# Patient Record
Sex: Male | Born: 1950 | Race: Black or African American | Hispanic: No | Marital: Married | Smoking: Current every day smoker
Health system: Southern US, Community
[De-identification: ages and names within clinical notes are randomized; demographics above are authoritative.]

## PROBLEM LIST (undated history)

## (undated) DIAGNOSIS — Z86718 Personal history of other venous thrombosis and embolism: Secondary | ICD-10-CM

## (undated) DIAGNOSIS — I10 Essential (primary) hypertension: Secondary | ICD-10-CM

## (undated) DIAGNOSIS — E119 Type 2 diabetes mellitus without complications: Secondary | ICD-10-CM

## (undated) DIAGNOSIS — R569 Unspecified convulsions: Secondary | ICD-10-CM

---

## 2003-05-06 ENCOUNTER — Other Ambulatory Visit: Payer: Self-pay

## 2003-07-07 ENCOUNTER — Other Ambulatory Visit: Payer: Self-pay

## 2006-07-08 ENCOUNTER — Emergency Department: Payer: Self-pay | Admitting: Internal Medicine

## 2006-07-17 ENCOUNTER — Ambulatory Visit: Payer: Self-pay | Admitting: Podiatry

## 2011-11-14 ENCOUNTER — Inpatient Hospital Stay: Payer: Self-pay | Admitting: Internal Medicine

## 2011-11-14 LAB — URINALYSIS, COMPLETE
Bacteria: NONE SEEN
Bilirubin,UR: NEGATIVE
Blood: NEGATIVE
Glucose,UR: >=500 mg/dL (ref 0–75)
Nitrite: NEGATIVE
Ph: 6 (ref 4.5–8.0)
Specific Gravity: 1.011 (ref 1.003–1.030)
Squamous Epithelial: NONE SEEN
WBC UR: <1 /HPF (ref 0–5)

## 2011-11-14 LAB — CBC WITH DIFFERENTIAL/PLATELET
Basophil %: 1.5 %
Eosinophil #: 0.1 10*3/uL (ref 0.0–0.7)
HGB: 13.6 g/dL (ref 13.0–18.0)
Lymphocyte %: 24.4 %
MCHC: 33.3 g/dL (ref 32.0–36.0)
Monocyte #: 0.5 x10 3/mm (ref 0.2–1.0)
Neutrophil %: 62.4 %
Platelet: 146 10*3/uL — ABNORMAL LOW (ref 150–440)
RBC: 4.17 10*6/uL — ABNORMAL LOW (ref 4.40–5.90)

## 2011-11-14 LAB — COMPREHENSIVE METABOLIC PANEL
Alkaline Phosphatase: 103 U/L (ref 50–136)
Anion Gap: 7 (ref 7–16)
BUN: 14 mg/dL (ref 7–18)
Bilirubin,Total: 0.4 mg/dL (ref 0.2–1.0)
Calcium, Total: 9.2 mg/dL (ref 8.5–10.1)
Chloride: 102 mmol/L (ref 98–107)
Co2: 28 mmol/L (ref 21–32)
Creatinine: 1.17 mg/dL (ref 0.60–1.30)
EGFR (African American): >60
EGFR (Non-African Amer.): >60
Osmolality: 284 (ref 275–301)
Potassium: 4.3 mmol/L (ref 3.5–5.1)
SGPT (ALT): 18 U/L
Sodium: 137 mmol/L (ref 136–145)
Total Protein: 8.1 g/dL (ref 6.4–8.2)

## 2011-11-14 LAB — APTT: Activated PTT: 26.7 secs (ref 23.6–35.9)

## 2011-11-14 LAB — HEMOGLOBIN A1C: Hemoglobin A1C: 11 % — ABNORMAL HIGH (ref 4.2–6.3)

## 2011-11-14 LAB — PROTIME-INR: INR: 0.9

## 2011-11-15 LAB — CBC WITH DIFFERENTIAL/PLATELET
Basophil #: 0.1 10*3/uL (ref 0.0–0.1)
Eosinophil %: 3.9 %
HCT: 34.1 % — ABNORMAL LOW (ref 40.0–52.0)
HGB: 11.2 g/dL — ABNORMAL LOW (ref 13.0–18.0)
Lymphocyte #: 1.2 10*3/uL (ref 1.0–3.6)
Lymphocyte %: 20.3 %
MCH: 32.2 pg (ref 26.0–34.0)
MCHC: 33 g/dL (ref 32.0–36.0)
MCV: 98 fL (ref 80–100)
Monocyte #: 0.6 x10 3/mm (ref 0.2–1.0)
Monocyte %: 10.4 %
Neutrophil %: 64.2 %
Platelet: 134 10*3/uL — ABNORMAL LOW (ref 150–440)
RBC: 3.49 10*6/uL — ABNORMAL LOW (ref 4.40–5.90)
WBC: 5.7 10*3/uL (ref 3.8–10.6)

## 2011-11-15 LAB — BASIC METABOLIC PANEL
Anion Gap: 7 (ref 7–16)
BUN: 22 mg/dL — ABNORMAL HIGH (ref 7–18)
Calcium, Total: 8.4 mg/dL — ABNORMAL LOW (ref 8.5–10.1)
Creatinine: 1.4 mg/dL — ABNORMAL HIGH (ref 0.60–1.30)
EGFR (African American): >60
EGFR (Non-African Amer.): 54 — ABNORMAL LOW
Glucose: 108 mg/dL — ABNORMAL HIGH (ref 65–99)
Osmolality: 291 (ref 275–301)

## 2012-09-12 ENCOUNTER — Ambulatory Visit: Payer: Self-pay | Admitting: Gastroenterology

## 2013-02-04 ENCOUNTER — Inpatient Hospital Stay: Payer: Self-pay | Admitting: Internal Medicine

## 2013-02-04 LAB — CBC
MCH: 31.8 pg (ref 26.0–34.0)
MCV: 93 fL (ref 80–100)
RBC: 4.01 10*6/uL — ABNORMAL LOW (ref 4.40–5.90)
WBC: 9.4 10*3/uL (ref 3.8–10.6)

## 2013-02-04 LAB — PROTIME-INR: Prothrombin Time: 13.7 secs (ref 11.5–14.7)

## 2013-02-04 LAB — BASIC METABOLIC PANEL
Calcium, Total: 9 mg/dL (ref 8.5–10.1)
Chloride: 98 mmol/L (ref 98–107)
Co2: 23 mmol/L (ref 21–32)
EGFR (African American): >60
EGFR (Non-African Amer.): 53 — ABNORMAL LOW
Osmolality: 277 (ref 275–301)
Potassium: 5.2 mmol/L — ABNORMAL HIGH (ref 3.5–5.1)

## 2013-02-05 LAB — BASIC METABOLIC PANEL
Anion Gap: 7 (ref 7–16)
Calcium, Total: 8.9 mg/dL (ref 8.5–10.1)
Chloride: 103 mmol/L (ref 98–107)
Co2: 24 mmol/L (ref 21–32)
Creatinine: 1.68 mg/dL — ABNORMAL HIGH (ref 0.60–1.30)
EGFR (African American): 50 — ABNORMAL LOW
EGFR (Non-African Amer.): 43 — ABNORMAL LOW
Glucose: 91 mg/dL (ref 65–99)
Osmolality: 278 (ref 275–301)
Sodium: 134 mmol/L — ABNORMAL LOW (ref 136–145)

## 2013-02-05 LAB — CBC WITH DIFFERENTIAL/PLATELET
Basophil #: 0.1 10*3/uL (ref 0.0–0.1)
Basophil %: 0.6 %
Eosinophil %: 0.3 %
HCT: 35.8 % — ABNORMAL LOW (ref 40.0–52.0)
Lymphocyte #: 0.6 10*3/uL — ABNORMAL LOW (ref 1.0–3.6)
Lymphocyte %: 5.6 %
MCH: 31.5 pg (ref 26.0–34.0)
MCHC: 34.5 g/dL (ref 32.0–36.0)
MCV: 91 fL (ref 80–100)
Monocyte #: 0.7 x10 3/mm (ref 0.2–1.0)
Neutrophil #: 8.7 10*3/uL — ABNORMAL HIGH (ref 1.4–6.5)
Neutrophil %: 87 %
Platelet: 213 10*3/uL (ref 150–440)
RBC: 3.92 10*6/uL — ABNORMAL LOW (ref 4.40–5.90)
WBC: 10 10*3/uL (ref 3.8–10.6)

## 2013-02-06 LAB — BASIC METABOLIC PANEL
Chloride: 101 mmol/L (ref 98–107)
Co2: 24 mmol/L (ref 21–32)
EGFR (African American): 57 — ABNORMAL LOW
EGFR (Non-African Amer.): 49 — ABNORMAL LOW
Osmolality: 276 (ref 275–301)
Potassium: 4.2 mmol/L (ref 3.5–5.1)
Sodium: 132 mmol/L — ABNORMAL LOW (ref 136–145)

## 2013-02-06 LAB — CBC WITH DIFFERENTIAL/PLATELET
HGB: 12 g/dL — ABNORMAL LOW (ref 13.0–18.0)
Lymphocyte #: 0.8 10*3/uL — ABNORMAL LOW (ref 1.0–3.6)
MCH: 31.9 pg (ref 26.0–34.0)
MCV: 93 fL (ref 80–100)
Neutrophil #: 7 10*3/uL — ABNORMAL HIGH (ref 1.4–6.5)
Platelet: 185 10*3/uL (ref 150–440)
RBC: 3.78 10*6/uL — ABNORMAL LOW (ref 4.40–5.90)
RDW: 15.7 % — ABNORMAL HIGH (ref 11.5–14.5)

## 2013-02-07 LAB — CBC WITH DIFFERENTIAL/PLATELET
Lymphocyte #: 0.9 10*3/uL — ABNORMAL LOW (ref 1.0–3.6)
MCV: 93 fL (ref 80–100)
Monocyte #: 1 x10 3/mm (ref 0.2–1.0)
Neutrophil %: 74.4 %
RBC: 3.68 10*6/uL — ABNORMAL LOW (ref 4.40–5.90)
WBC: 7.4 10*3/uL (ref 3.8–10.6)

## 2013-02-07 LAB — BASIC METABOLIC PANEL
Anion Gap: 6 — ABNORMAL LOW (ref 7–16)
BUN: 28 mg/dL — ABNORMAL HIGH (ref 7–18)
Creatinine: 1.22 mg/dL (ref 0.60–1.30)
EGFR (Non-African Amer.): >60
Glucose: 121 mg/dL — ABNORMAL HIGH (ref 65–99)
Osmolality: 271 (ref 275–301)
Sodium: 132 mmol/L — ABNORMAL LOW (ref 136–145)

## 2013-02-07 LAB — URINALYSIS, COMPLETE
Bilirubin,UR: NEGATIVE
Nitrite: NEGATIVE
Ph: 5 (ref 4.5–8.0)
Protein: 30
Squamous Epithelial: NONE SEEN
WBC UR: 1 /HPF (ref 0–5)

## 2013-02-08 LAB — BASIC METABOLIC PANEL
BUN: 21 mg/dL — ABNORMAL HIGH (ref 7–18)
Calcium, Total: 8.2 mg/dL — ABNORMAL LOW (ref 8.5–10.1)
Co2: 26 mmol/L (ref 21–32)
Creatinine: 1.1 mg/dL (ref 0.60–1.30)
EGFR (African American): >60
Glucose: 109 mg/dL — ABNORMAL HIGH (ref 65–99)
Osmolality: 274 (ref 275–301)
Potassium: 4.2 mmol/L (ref 3.5–5.1)

## 2013-02-09 LAB — BASIC METABOLIC PANEL
Calcium, Total: 8.1 mg/dL — ABNORMAL LOW (ref 8.5–10.1)
Chloride: 105 mmol/L (ref 98–107)
Co2: 25 mmol/L (ref 21–32)
Creatinine: 1.25 mg/dL (ref 0.60–1.30)
EGFR (African American): >60
EGFR (Non-African Amer.): >60
Glucose: 149 mg/dL — ABNORMAL HIGH (ref 65–99)
Osmolality: 273 (ref 275–301)
Sodium: 134 mmol/L — ABNORMAL LOW (ref 136–145)

## 2013-02-09 LAB — HEMOGLOBIN: HGB: 9.9 g/dL — ABNORMAL LOW (ref 13.0–18.0)

## 2013-02-09 LAB — PLATELET COUNT: Platelet: 212 10*3/uL (ref 150–440)

## 2013-02-10 LAB — BASIC METABOLIC PANEL
Anion Gap: 4 — ABNORMAL LOW (ref 7–16)
BUN: 20 mg/dL — ABNORMAL HIGH (ref 7–18)
Calcium, Total: 8.1 mg/dL — ABNORMAL LOW (ref 8.5–10.1)
Chloride: 105 mmol/L (ref 98–107)
Co2: 26 mmol/L (ref 21–32)
Glucose: 133 mg/dL — ABNORMAL HIGH (ref 65–99)
Osmolality: 275 (ref 275–301)
Sodium: 135 mmol/L — ABNORMAL LOW (ref 136–145)

## 2013-02-13 LAB — PATHOLOGY REPORT

## 2013-07-17 ENCOUNTER — Other Ambulatory Visit: Payer: Self-pay | Admitting: Internal Medicine

## 2013-07-17 LAB — TROPONIN I

## 2013-07-22 ENCOUNTER — Ambulatory Visit: Payer: Self-pay | Admitting: Internal Medicine

## 2013-11-28 ENCOUNTER — Emergency Department: Payer: Self-pay | Admitting: Emergency Medicine

## 2013-11-28 LAB — URINALYSIS, COMPLETE
Bacteria: NONE SEEN
Bilirubin,UR: NEGATIVE
GLUCOSE, UR: NEGATIVE mg/dL (ref 0–75)
Ketone: NEGATIVE
Leukocyte Esterase: NEGATIVE
Nitrite: NEGATIVE
PH: 5 (ref 4.5–8.0)
Protein: 100
SPECIFIC GRAVITY: 1.013 (ref 1.003–1.030)
Squamous Epithelial: <1
WBC UR: 1 /HPF (ref 0–5)

## 2013-11-28 LAB — COMPREHENSIVE METABOLIC PANEL
ALT: 47 U/L (ref 12–78)
ANION GAP: 4 — AB (ref 7–16)
Albumin: 3.2 g/dL — ABNORMAL LOW (ref 3.4–5.0)
Alkaline Phosphatase: 96 U/L
BUN: 38 mg/dL — AB (ref 7–18)
Bilirubin,Total: 0.3 mg/dL (ref 0.2–1.0)
CO2: 23 mmol/L (ref 21–32)
CREATININE: 1.87 mg/dL — AB (ref 0.60–1.30)
Calcium, Total: 8.5 mg/dL (ref 8.5–10.1)
Chloride: 104 mmol/L (ref 98–107)
EGFR (African American): 44 — ABNORMAL LOW
GFR CALC NON AF AMER: 38 — AB
Glucose: 228 mg/dL — ABNORMAL HIGH (ref 65–99)
Osmolality: 279 (ref 275–301)
POTASSIUM: 4.8 mmol/L (ref 3.5–5.1)
SGOT(AST): 43 U/L — ABNORMAL HIGH (ref 15–37)
SODIUM: 131 mmol/L — AB (ref 136–145)
Total Protein: 7.1 g/dL (ref 6.4–8.2)

## 2013-11-28 LAB — CBC
HCT: 33.5 % — ABNORMAL LOW (ref 40.0–52.0)
HGB: 11 g/dL — ABNORMAL LOW (ref 13.0–18.0)
MCH: 31.7 pg (ref 26.0–34.0)
MCHC: 32.9 g/dL (ref 32.0–36.0)
MCV: 96 fL (ref 80–100)
Platelet: 273 10*3/uL (ref 150–440)
RBC: 3.47 10*6/uL — ABNORMAL LOW (ref 4.40–5.90)
RDW: 15.1 % — AB (ref 11.5–14.5)
WBC: 5.2 10*3/uL (ref 3.8–10.6)

## 2013-11-28 LAB — CK TOTAL AND CKMB (NOT AT ARMC)
CK, TOTAL: 211 U/L
CK-MB: 3.1 ng/mL (ref 0.5–3.6)

## 2013-11-28 LAB — TROPONIN I: Troponin-I: <0.02 ng/mL

## 2014-09-04 NOTE — Op Note (Signed)
PATIENT NAME:  Andrew Dillon, Andrew Dillon MR#:  [id number] DATE OF BIRTH:  Apr 30, 1951  DATE OF PROCEDURE:  02/07/2013  PREOPERATIVE DIAGNOSIS: Displaced right femoral neck fracture.   POSTOPERATIVE DIAGNOSIS: Displaced right femoral neck fracture (pathology pending).   PROCEDURE PERFORMED: Right hip hemiarthroplasty.   SURGEON: Illene LabradorJames P. Angie FavaHooten Jr., MD   ANESTHESIA: Spinal.   ESTIMATED BLOOD LOSS: 300 mL.   FLUIDS REPLACED: 2200 mL of crystalloid.   DRAINS: Two medium drains to Hemovac reservoir.   IMPLANTS UTILIZED: DePuy size 6 Summit femoral stem (cemented), 12 mm Cementralizer, 52 mm outer diameter Cathcart hip ball, a +0 mm tapered spacer, and a size 4 cement restrictor.   INDICATIONS FOR SURGERY: The patient is a 64 year old male who fell at home and was unable to stand or bear weight due to right hip pain. X-ray demonstrated findings of a displaced femoral neck fracture. There was a question if this was a spontaneous fracture. The patient denies tripping or any dizziness. After discussion of the risks and benefits of surgical intervention, the patient expressed understanding of the risks and benefits and agreed with plans for surgical intervention.   PROCEDURE IN DETAIL: The patient was brought into the operating room and after adequate spinal anesthesia was achieved, the patient was placed in a left lateral decubitus position. Axillary roll was placed and all bony prominences were well padded. The patient's right hip and leg were cleaned and prepped with alcohol and DuraPrep and draped in the usual sterile fashion. A "timeout" was performed as per usual protocol. A lateral curvilinear incision was made gently curving towards the posterior superior iliac spine. IT band was incised in line with the skin incision and the fibers of the gluteus maximus were split in line. Piriformis tendon was identified, skeletonized, and incised at its insertion at the proximal femur and reflected posteriorly. In a  similar fashion, the short external rotators were incised and reflected posteriorly. A T-type posterior capsulotomy was performed. A moderate hemarthrosis was evacuated. The femoral head was removed using a corkscrew device and measured using calipers, and it was felt that the 52 mm diameter was appropriate. A relatively large amount of comminution was noted about the femoral neck. The femoral neck cut was performed using an oscillating saw. Inspection of the acetabulum was performed and articular surface was in good condition. Several bony fragments were removed. A pilot hole for preparation of the proximal femur was performed and a conical reamer was inserted. Serial broaches were inserted up to a size 6 broach. The calcar region was planed accordingly and trial reduction was performed with a 52 mm hip ball with a +0 mm neck length. Good equalization of limb lengths was noted and excellent stability was appreciated both anteriorly and posteriorly. Trial components were removed. The femoral canal was sized and it was felt that a size 4 cement restrictor was appropriate. The cement restrictor was inserted to the appropriate depth. Proximal femoral canal was irrigated with copious amounts of normal saline with antibiotic solution using pulsatile lavage and then suctioned dry. The canal was then packed with vaginal packing soaked in dilute Neo-Synephrine. Polymethylmethacrylate cement with gentamicin was prepared in the usual fashion using a vacuum mixer. Vaginal packing was removed and the canal again irrigated and suctioned dry. The cement was introduced in a retrograde fashion and pressurized. A size 6 Summit femoral stem with a 12 mm Cementralizer was positioned and then impacted into place. Excess cement was removed using Personal assistantreer elevators. After adequate curing of the  cement, trial reduction was performed with a 52 mm hip ball with a +0 neck length. Again, excellent stability and equalization of limb lengths was  appreciated. The trial hip ball was removed. The Morse taper was cleaned and dried. A 52 mm outer diameter Cathcart hip ball with a +0 mm tapered spacer was placed on the trunnion and impacted into place. The acetabulum was again irrigated and suctioned dry and inspected for any residual debris. The hip was reduced and placed through a range of motion with excellent stability noted. The wound was irrigated with copious amounts of normal saline with antibiotic solution using pulsatile lavage and then suctioned dry. The posterior capsulotomy was repaired using #5 Ethibond. The piriformis tendon was reapproximated on the undersurface of the gluteus medius tendon using #5 Ethibond. Two medium drains were placed in the wound bed and brought out through a separate stab incision to be attached to a Hemovac reservoir. IT band was repaired using interrupted sutures of #1 Vicryl. The subcutaneous tissue was approximated in layers using first #0 Vicryl followed by 2-0 Vicryl. Skin was closed with skin staples. A sterile dressing was applied.   The patient tolerated the procedure well. He was transported to the recovery room in stable condition.   ____________________________ Illene Labrador. Angie Fava., MD jph:jm D: 02/08/2013 11:46:54 ET T: 02/08/2013 12:17:21 ET JOB#: [id number]  cc: Fayrene Fearing P. Angie Fava., MD, <Dictator> Chrstopher P Angie Fava MD ELECTRONICALLY SIGNED 02/15/2013 9:39

## 2014-09-04 NOTE — H&P (Signed)
PATIENT NAME:  Andrew Dillon, Andrew Dillon MR#:  [id number] DATE OF BIRTH:  December 14, 1950  DATE OF ADMISSION:  02/04/2013  HISTORY OF PRESENT ILLNESS: Andrew Dillon is a 64 year old black married gentleman who was brought to the Emergency Room after an unexplained fall at home. In the Emergency Room, the patient was found to have a right hip fracture and is, therefore, being admitted for further evaluation and treatment.   The patient's past medical history is most notable for recurrent DVT and pulmonary emboli. Apparently, he had a history of poor compliance with medications and had an IVC filter placed in 2013. Since that time, he has been on Xarelto. The patient also has a questionable history of nocturnal seizure disorder. He has had peptic ulcer disease in the past. He has a history of type 2 diabetes. He is followed by Dr. Tedd Sias. He has a history of hyperlipidemia. He has a remote history of tobacco and alcohol use.   ALLERGIES:  THE PATIENT IS INTOLERANT OF METFORMIN.   CURRENT MEDICATIONS 1.  Actos 30 mg daily.  2.  Zyrtec 10 mg daily p.r.n.  3.  Xarelto 20 mg daily, note that he had not taken his medication today.  4.  Victoza 1.8 mcg subcutaneously daily.  5.  Levemir 16 units in the morning and 8 units in the evening.  6.  Losartan 50 mg daily.  8.  Zyban 150 mg 1 tablet b.i.d.   SOCIAL HISTORY: He smokes cigars. He used to smoke cigarettes. He is currently not using alcohol.   FAMILY HISTORY: Notable for the fact that his father died of a stomach cancer at age 65. He had a brother die of a stroke at age 56. There is an extended family history of thyroid disease, bladder cancer, diabetes and coronary artery disease.   REVIEW OF SYSTEMS: Essentially unremarkable as per the Emergency Room questionnaire.   ADMISSION PHYSICAL EXAMINATION VITAL SIGNS: Temperature 97.7, pulse 101, respirations 20, blood pressure 162/78, pulse ox is 96% on room air.  GENERAL: This is a middle-aged black gentleman who  does not appear to be in any acute distress at the present time.  SKIN: Normal in color and texture. There is no lymphadenopathy.  HEENT: Examination of head, ears, eyes, nose and throat was notable for arterial narrowing on funduscopic exam. No diabetic retinopathy was seen. The nose and throat were clear. The patient was edentulous.  NECK: Supple. Thyroid was not enlarged. There was no JVD.  There were no carotid bruits.  LUNGS: Clear to auscultation.  CARDIAC: Irregular tachycardia without murmurs or gallops. S1 and S2 were normal.  ABDOMEN: Soft and nontender. Liver and spleen are not enlarged. Bowel sounds were normal.  GENITAL AND RECTAL: Deferred.  EXTREMITIES: No edema. The right foot was pointed outward.  NEUROLOGIC: Exam was felt to be physiological.   LABORATORY DATA:  CBC showed a hemoglobin of 12.8 with a hematocrit of 37.3, white count 9400, platelet was 236,000. Admission basic metabolic panel showed a random blood sugar of 197. BUN was 33 with a creatinine 1.4, sodium was 132, potassium was 5.2. The remainder of the panel was unremarkable. There was a note on the basic metabolic panel that there was slight hemolysis which probably explains the potassium.   PLAN:  The patient will be admitted to the orthopedic floor and an orthopedic consultation will be requested. His regular medications will be continued with the exception of Xarelto which will be held in anticipation of surgical correction of his hip  fracture. The patient will be placed on Lovenox pending surgery.   ____________________________ Letta PateJohn B. Danne HarborWalker III, MD jbw:cs D: 02/04/2013 18:37:00 ET T: 02/04/2013 18:58:48 ET JOB#: [id number]  cc: Jonny RuizJohn B. Danne HarborWalker III, MD, <Dictator> Elmo PuttJOHN B WALKER III MD ELECTRONICALLY SIGNED 02/05/2013 7:25

## 2014-09-04 NOTE — Consult Note (Signed)
PATIENT NAME:  Andrew Dillon, Andrew Dillon MR#:  [id number] DATE OF BIRTH:  February 10, 1951  DATE OF CONSULTATION:  02/04/2013  REQUESTING PHYSICIAN: Letta Pate. Danne Harbor, MD  CONSULTING PHYSICIAN:  Illene Labrador. Angie Fava., MD   CHIEF COMPLAINT: Right hip pain.   HISTORY OF PRESENT ILLNESS: The patient is a 64 year old male who was standing at home when the right leg buckling gave way, causing him to fall to the ground. He was unable to stand or bear weight due to the right hip pain. He denies any loss of consciousness. He denies any other pain or injuries other than the right hip pain. He denies any of weakness or numbness. He has had no previous hip pain. He presented to [facility] Emergency Room, at which time x-rays demonstrated a displaced right femoral neck fracture.   PAST MEDICAL HISTORY: Recurrent pulmonary emboli, nocturnal seizure disorder, peptic ulcer disease, history of tobacco and alcohol abuse, history of right lower extremity deep venous thrombosis, insulin-dependent diabetes mellitus, hyperlipidemia.   PAST SURGICAL HISTORY: IVC filter placement.   MEDICATIONS AT THE TIME OF ADMISSION: Actos 30 mg daily, Zyrtec 10 mg daily, p.r.n., Xarelto 20 mg daily, Victoza  1.8 mcg subcu daily, Levemir 16 units each morning and in the evening, losartan 50 mg daily, Zyban 150 mg b.i.d.   SOCIAL HISTORY: Positive for tobacco use (cigars), occasional alcohol use. The patient is married and lives at home.   FAMILY HISTORY: Positive for thyroid disease, bladder cancer, diabetes, coronary artery disease (mother). Positive for CVA in a brother. Positive for stomach cancer in his father.   ALLERGIES: METFORMIN.   REVIEW OF SYSTEMS: Pertinent musculoskeletal review of systems is positive for right hip pain as noted above. No significant back pain or radicular symptoms. No muscle weakness.     PHYSICAL EXAMINATION: GENERAL: The patient is a well-developed, well-nourished male seen supine in bed in no acute distress.   HEAD, EYES, EARS, NOSE, AND THROAT: Atraumatic, normocephalic. Sclerae are clear. Extraocular motions intact. Oropharynx is clear.   NECK: Supple, nontender with good range of motion.  LUNGS: Clear to auscultation bilaterally.  CARDIAC: Regular rate and rhythm with normal S1, S2. No appreciable murmurs, gallops or rubs.  ABDOMEN: Soft, nontender, nondistended.  MUSCULOSKELETAL: Examination shows good range of motion, strength, stability of the upper extremities. Examination of lower extremities is significant for that of the right lower extremity. The right leg is shortened and externally rotated. Pain is elicited with any attempt at range of motion of the right hip. No knee effusion. No gross tenderness about the knee or  ankle.  NEUROLOGIC: Awake, alert, oriented. Sensory function is grossly intact. Motor strength is felt to be 5/5 with the exception of the right lower extremity which was not assessed due to the injury. No clonus or tremor.   X-RAYS: AP pelvis, AP and lateral radiographs of the right hip obtained at [facility] earlier today were reviewed. There is a displaced right femoral neck fracture. Appropriate mineralization of the bone is appreciated. No apparent lytic lesions.   IMPRESSION: Displaced right femoral neck fracture.   PLAN: Findings were discussed in detail with the patient. Recommendation was made for right hip hemiarthroplasty. Given the patient's history of pulmonary emboli, I have discussed the patient's status with anesthesia, and it would be preferable to perform the surgery under spinal anesthesia. Given his Xarelto use, I have discussed with Dr. Darleene Cleaver, and he has suggested holding the Xarelto for a total of 3 days with the ability to  proceed with surgery on Friday, hopefully under regional anesthesia.   Possible need for blood was discussed with the patient. The risks and benefits of blood transfusion were discussed at length. He expressed his understanding of the  risks and benefits and will sign the appropriate blood transfusion consents.   The risks and benefits of right hip hemiarthroplasty were also discussed in detail. The usual perioperative course was discussed. The patient expressed understanding of the risks and benefits   and agreed with plans for surgical intervention. The right hip was signed as per the right site surgery protocol.    ____________________________ Illene LabradorJames P. Angie FavaHooten Jr., MD jph:cb D: 02/04/2013 21:39:04 ET T: 02/04/2013 21:56:41 ET JOB#: [id number]  cc: Illene LabradorJames P. Angie FavaHooten Jr., MD, <Dictator> Murvin P Angie FavaHOOTEN JR MD ELECTRONICALLY SIGNED 02/15/2013 9:39

## 2014-09-04 NOTE — Consult Note (Signed)
Brief Consult Note: Diagnosis: Displaced right femoral neck fracture.   Patient was seen by consultant.   Consult note dictated.   Recommend to proceed with surgery or procedure.   Comments: Xarelto held. Discussed with Anesthesiology who recommended holding Xarelto for 3 days in order to proceed with regional (spinal) anesthesia. Anticipate surgery on Friday.  The risks and benefits of surgical intervention were discussed in detail with the patient. The patient expressed understanding of the risks and benefits and agreed with plans for surgery.   Surgical site signed as per "right site surgery" protocol.  Electronic Signatures: Donato HeinzHooten, Lonza P (MD)  (Signed 23-Sep-14 21:41)  Authored: Brief Consult Note   Last Updated: 23-Sep-14 21:41 by Donato HeinzHooten, [place] P (MD)

## 2014-09-04 NOTE — Discharge Summary (Signed)
PATIENT NAME:  Genoveva IllFODDRELL, Koron E MR#:  [id number] DATE OF BIRTH:  05/12/1951  DATE OF ADMISSION:  02/04/2013 DATE OF DISCHARGE:  02/10/2013  FINAL DIAGNOSES:  1.  Right hip fracture.  2.  Adult onset diabetes mellitus, uncontrolled.  3.  Hyponatremia.  4.  History of nocturnal seizure.  5.  History of deep vein thrombosis, on chronic Xarelto.  6.  Hypertension.   PRINCIPLE PROCEDURES:  Open reduction and internal fixation of right hip.   HISTORY AND PHYSICAL:  Please see dictated admission history and physical; please also see the operative report for further information.   SUMMARY OF HOSPITAL COURSE:  The patient was admitted after right hip fracture. It is unclear whether he fell or whether the hip fractured spontaneously. Pathology report on the bone fragments is pending at this time. The patient had chronically been on Xarelto, and so initially, had to be observed while the Xarelto wore off. After an adequate amount of time had passed, he was taken to the operating room for surgical repair, which he underwent without issue.   Medication adjustments were required for control of his diabetes, but gradually this came under control. His pain was reasonably stable. Blood pressure reasonably controlled also. Physical therapy worked with the patient, and he showed good progress with this. He is able to be discharged to home with home health physical therapy. His physical activity should be up with a walker, and he was given wound instructions from orthopedics. He should follow an 1800-calorie ADA diet, and should check his sugars daily and record this. We will plan for him to follow up in our office in the next 2 weeks and follow up with orthopedics as scheduled.   DISCHARGE MEDICATIONS:  1.  Actos 30 mg p.o. daily.  2.  Losartan 50 mg p.o. daily.  3.  Omeprazole 20 mg p.o. daily.  4.  Victoza 1.2 mcg subcutaneous daily.  5.  Oxycodone 5 mg p.o. q.4 hours as needed for severe pain.  6.   Tramadol 50 mg p.o. q.4 hours p.r.n. pain.  7.  Xarelto 20 mg p.o. daily.      ____________________________ Lynnea FerrierBert J. Duc Crocket III, MD bjk:ms D: 02/11/2013 17:37:43 ET T: 02/11/2013 19:36:01 ET JOB#: [id number]  cc: Lynnea FerrierBert J. Morgane Joerger III, MD, <Dictator> Daniel NonesBERT Annamae Shivley MD ELECTRONICALLY SIGNED 02/12/2013 13:03

## 2014-09-06 NOTE — Discharge Summary (Signed)
PATIENT NAME:  Andrew Dillon, Andrew Dillon MR#:  [id number] DATE OF BIRTH:  [birth year]  DATE OF ADMISSION:  11/14/2011 DATE OF DISCHARGE:  11/15/2011  FINAL DIAGNOSES:  1. Right leg deep venous thrombosis extending up into the femoral vein.  2. History of recurrent pulmonary emboli and prior deep vein thrombosis, with poor compliance with Coumadin.  3. History of nocturnal seizure disorder, no longer on treatment.  4. History of peptic ulcer disease.  5. History neck fracture.  6. History of diabetes mellitus, uncontrolled.  7. Hyperlipidemia.   HISTORY AND PHYSICAL: Please see dictated admission history and physical.   SUMMARY OF HOSPITAL COURSE: Patient was admitted after finding of deep vein thrombosis extending throughout the right lower extremity and into the right femoral vein. It had been approximately 15 months since the patient had followed up to get his Coumadin checked. Vascular surgery was consulted, and due to his poor compliance, and in order to give him further protection, recommendation was made for IVC filter, and this was placed. The method of further anticoagulation, which was felt necessary to try to decrease some of his symptoms, was discussed at length with the patient and with vascular surgery as well. The election is to place the patient on Xarelto so that he does not need to have follow up blood work done quite as regularly, and he will be initiated on 15 mg twice a day for three weeks, then go to 20 mg daily. The side effects of this medication, including the lack of an antidote for significant bleeding, was discussed at length with the patient and he expressed understanding. The absolute importance of medical compliance was stressed to the patient as well and he again expressed understanding to us.   Patient has a history of uncontrolled diabetes, unfortunately this also had not been evaluated due to the patient's failure to follow up. His A1c was found to be 11 here, once we put him  on his medications his sugars actually were better, though he did require some insulin infusions periodically for sliding scale. At this point, we will reassess this once we get him taking his medications more regularly and make further decisions on treatment. He had been on lovastatin in the past for cholesterol but he has come off this, did not wish to go back on. Would recommend use of lisinopril 10 mg daily for elevated blood pressure, however, refused to take this as well.   At this time he will be discharged to home in stable condition with his physical activity to be up as tolerated. Will decide on his return to work after we see him in the office. He should follow an 1800 calorie ADA diet and record his blood sugars twice a day before meals so we can further adjust. He will follow up in our office within the next 1 to 2 weeks and with Dr. Gilda CreaseSchnier in 3 to 4 weeks as is already scheduled. He should elevate his leg when he is lying and sitting.   DISCHARGE MEDICATIONS:  1. Glipizide 10 mg p.o. b.i.d.  2. Actos 30 mg p.o. daily.  (dictation cut off here)  ____________________________ Lynnea FerrierBert J. Klein III, MD bjk:cms D: 11/15/2011 12:53:46 ET T: 11/15/2011 15:30:49 ET JOB#: [id number]  cc: Lynnea FerrierBert J. Klein III, MD, <Dictator>  Daniel NonesBERT KLEIN MD ELECTRONICALLY SIGNED 11/20/2011 13:23

## 2014-09-06 NOTE — H&P (Signed)
PATIENT NAME:  Andrew Dillon, Andrew Dillon MR#:  [id number] DATE OF BIRTH:  01/10/1951  DATE OF ADMISSION:  11/14/2011  CHIEF COMPLAINT: Right leg swelling.   HISTORY OF PRESENT ILLNESS: 64 year old male with history of recurrent pulmonary emboli, history of right lower extremity deep vein thrombosis, who has history of poor compliance with his medications. He also has uncontrolled diabetes. He had not been back to see [place] in the office in over a year, and had not returned for Coumadin checks over this time either. He insists he has continued to take his Coumadin. He has not had any bleeding. Over the last 5 to 6 weeks he has had increasing swelling in his right leg, without significant pain. He has had no chest pain. He has some minimal shortness of breath at baseline, which was not significantly different. He has not checked his sugars. On evaluation in the office he was found to have significant swelling throughout his right lower extremity and was referred for Doppler, which confirms finding of deep vein thrombosis, extending up into the right femoral vein and almost completely occlusive. He is admitted now for treatment of his deep vein thrombosis, as well as further evaluation to determine longer-term treatment given his medical compliance issues.   PAST MEDICAL HISTORY:  1. 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  2. History of nocturnal seizure disorder, previously evaluated by Dr. Michaela Corner. Had been on Keppra, which patient has elected to come off. No events in the last several years.  3. Peptic ulcer disease.  4. History of neck fracture.  5. History of tobacco and alcohol abuse.  6. History of right lower extremity deep vein thrombosis.  7. Diabetes mellitus, uncontrolled.  8. Hyperlipidemia.   ALLERGIES: Metformin.   MEDICATIONS:  1. Actos 30 mg p.o. daily. 2. Coumadin 7.5 mg p.o. daily.  3. Glipizide 20 mg p.o. b.i.d.   4. Zyrtec 10 mg p.o. daily p.r.n.   SOCIAL HISTORY: History of tobacco abuse and stopping in 2006 but does still smoke some cigars. No current alcohol.   FAMILY HISTORY: Thyroid disease, bladder cancer, diabetes, coronary artery disease, stroke.   REVIEW OF SYSTEMS: Please see history of present illness. Some blurring of vision. No dysphagia. No bowel changes. Remainder of complete review of systems is negative.   PHYSICAL EXAMINATION:  VITAL SIGNS: Weight 155, blood pressure 145/70, pulse 89.   GENERAL: Well-developed, well-nourished male, no distress.   EYES: Pupils round and reactive to light. Lids and conjunctivae unremarkable.   ENT: Examination unremarkable. Oropharynx moist without lesions.   NECK: Supple, trachea midline. No thyromegaly.   CARDIOVASCULAR: Regular rate and rhythm without murmurs, gallops, or rubs. Carotid and radial pulses 2+.   LUNGS: Clear bilaterally. No retractions. Saturation 98% on room air.   ABDOMEN: Soft, nontender, nondistended. Positive bowel sounds. No guarding, rebound.   SKIN: No significant rashes or nodules.   LYMPH NODES: No cervical or supraclavicular nodes.   MUSCULOSKELETAL: No clubbing or cyanosis. 2 to 3+ edema in the right lower extremity with negative Homans. 1+ edema in the left lower extremity. Distal hair loss. No foot lesions.   NEUROLOGIC: Cranial nerves are intact. Light touch decreased in the feet bilaterally in a stocking distribution.    IMPRESSION AND PLAN:  1. Right lower extremity deep vein thrombosis with medical noncompliance. Will start off with Lovenox, continue Coumadin. Will ask vascular surgery see the patient and we may need to discuss IVC filter  placement for protection. Ultimately we may need to try Xarelto, but would need to determine whether this was going to be feasible based on expense. Will get MET-C, CBC, INR, and PTT today. I will not send extensive procoagulant work-up as this was done previously, and I  don't think it is going to change the fact that he needs to be compliant and on anticoagulation.  2. Diabetes mellitus, uncontrolled. Continue Actos and glipizide. Cover with sliding scale insulin. His last A1c was 15, and this was last year, with the patient not following up with endocrinology.  3. Hypertension. Will add low dose ACE inhibitor and follow renal function.   Above was discussed with patient and his family and they are in agreement.   ____________________________ Adin Hector, MD bjk:cms D: 11/14/2011 09:46:04 ET T: 11/14/2011 10:39:22 ET JOB#: [id number]  cc: Adin Hector, MD, <Dictator> Ramonita Lab MD ELECTRONICALLY SIGNED 11/20/2011 13:22

## 2014-09-06 NOTE — Consult Note (Signed)
Brief Consult Note: Diagnosis: recurrent PE with new right leg DVT in spite of Coumadin.   Patient was seen by consultant.   Recommend to proceed with surgery or procedure.   Comments: Patient shoud have IVC filter placed Risks and benefits discussed all questions answered patient agrees to proceed.  Electronic Signatures: Levora DredgeSchnier, Gregory (MD)  (Signed 02-Jul-13 12:51)  Authored: Brief Consult Note   Last Updated: 02-Jul-13 12:51 by Levora DredgeSchnier, Gregory (MD)

## 2014-09-06 NOTE — Op Note (Signed)
PATIENT NAME:  Andrew Dillon, Andrew Dillon DATE OF BIRTH:  Nov 18, 1950  DATE OF PROCEDURE:  11/14/2011  PREOPERATIVE DIAGNOSES:  1. Acute recurrent deep venous thrombosis, right lower extremity.  2. Bilateral pulmonary emboli.  3. Coumadin failure.  POSTOPERATIVE DIAGNOSES:  1. Acute recurrent deep venous thrombosis, right lower extremity.  2. Bilateral pulmonary emboli.  3. Coumadin failure.  PROCEDURES PERFORMED: 1. Inferior venacavogram.  2. Placement of infrarenal inferior vena caval filter, Denali type.   SURGEON: Renford DillsGregory G. Paulette Rockford, MD  SEDATION: Versed 2 mg IV.   ACCESS: 9-1/2 French sheath, right common femoral vein.   CONTRAST USED: Isovue 15 mL.   FLUOROSCOPY TIME: 1.0 minutes.   INDICATIONS: Mr. Andrew BrookFoddrell is a 64 year old gentleman who presented to Dr. Odessa FlemingKlein's office with significant swelling of his right lower extremity. Work-up demonstrated acute deep venous thrombosis of the right leg. The patient currently is on Coumadin but there are questions of compliance. He has a history of deep venous thrombosis in the right leg as well as bilateral pulmonary emboli. He is therefore undergoing IVC filter placement for prevention of lethal pulmonary embolism while his anticoagulation status is worked out. The risks and benefits are reviewed, all questions answered. Patient agrees to proceed.   DESCRIPTION OF PROCEDURE: Patient is taken to special procedures, placed in supine position. After adequate sedation is achieved his right groin is prepped and draped in sterile fashion. Ultrasound is then placed in a sterile sleeve. Ultrasound is utilized secondary to lack of appropriate landmarks, to avoid vascular injury. Under direct ultrasound visualization, the common femoral vein is identified. It is echolucent and compressible. As you scan more distally into the superficial femoral vein you do see thrombus, however, the common femoral vein is compressible and free of thrombus. Image  is recorded for the permanent record. With real-time continuous visualization, the Seldinger needle is inserted. J-wire is hanging up and therefore a microwire is advanced through the needle, micro sheath followed by the reinsertion of the J-wire which now easily advances. Delivery sheath is then advanced over the wire and positioned at the confluence of the iliac veins. Bolus injection of contrast is used to demonstrate the inferior vena cava. Renal veins are identified. Cava is somewhat angulated at the level of the renal veins but in the infrarenal portion it is straight, measures approximately 20 mm in diameter and is suitable for positioning of a filter. The wire is reintroduced. The sheath is advanced into position. Dilator and wire are removed and the [facility] filter is advanced and then deployed without difficulty. Image is recorded for the permanent record. Sheath is pulled, pressure is held and safeguard is placed. There are no immediate complications.   INTERPRETATION: The inferior vena cava is opacified with a bolus injection of contrast. There are no filling defects. Just below the level of the renal veins there is a fairly sharp angulation, however, below this the majority of the infrarenal portion of the cava is normal, free of any defects and suitable as a landing zone for the filter. Denali filter is deployed with excellent orientation above the confluence of the iliac veins.   SUMMARY: Successful placement of retrievable IVC filter in the infrarenal location.  ____________________________ Renford DillsGregory G. Abou Sterkel, MD ggs:cms D: 11/14/2011 14:01:33 ET T: 11/14/2011 14:41:47 ET JOB#: [id number]  cc: Renford DillsGregory G. Rease Wence, MD, <Dictator> Lynnea FerrierBert J. Klein III, MD  Renford DillsGREGORY G Tijana Walder MD ELECTRONICALLY SIGNED 11/21/2011 12:23

## 2014-09-06 NOTE — Discharge Summary (Signed)
PATIENT NAME:  Andrew Dillon, Malachi E MR#:  [id number] DATE OF BIRTH:  1950-08-11  DATE OF ADMISSION:  11/14/2011 DATE OF DISCHARGE:  11/15/2011  ADDENDUM: Continuation of discharge summary.  MEDICATIONS:  1. Glipizide 20 mg p.o. twice a day. 2. Actos 30 mg p.o. daily. 3. Xarelto 15 mg p.o. twice a day x21 days, then 20 mg p.o. daily.  4. Omeprazole 20 mg p.o. daily for stomach protection.   He was given instructions to stop Coumadin. He should avoid aspirin, ibuprofen, and other anti-inflammatories. He may use Tylenol as needed for pain or fever. ____________________________ Lynnea FerrierBert J. Jaliana Medellin III, MD bjk:slb D: 11/15/2011 12:54:57 ET T: 11/15/2011 15:24:59 ET JOB#: [id number]  cc: Lynnea FerrierBert J. Marquavius Scaife III, MD, <Dictator> Daniel NonesBERT Marjie Chea MD ELECTRONICALLY SIGNED 11/20/2011 13:22

## 2014-09-17 IMAGING — CR RIGHT HIP - COMPLETE 2+ VIEW
1 series · 4 of 4 positions shown · non-contrast
Comparison: none

REASON FOR EXAM: fall, pain R hip
COMMENTS:

PROCEDURE:     DXR - DXR HIP RIGHT COMPLETE  - February 04, 2013  [DATE]
RESULT:     There is a subcapital fracture in the proximal right femur with
small comminuted fragments present. There is superior migration. The femoral
head is smoothly marginated and remains in the acetabulum.

[Series 1: t hip lat right · 0.14mm/px · 4 of 4 slices shown]
[im 1/4]
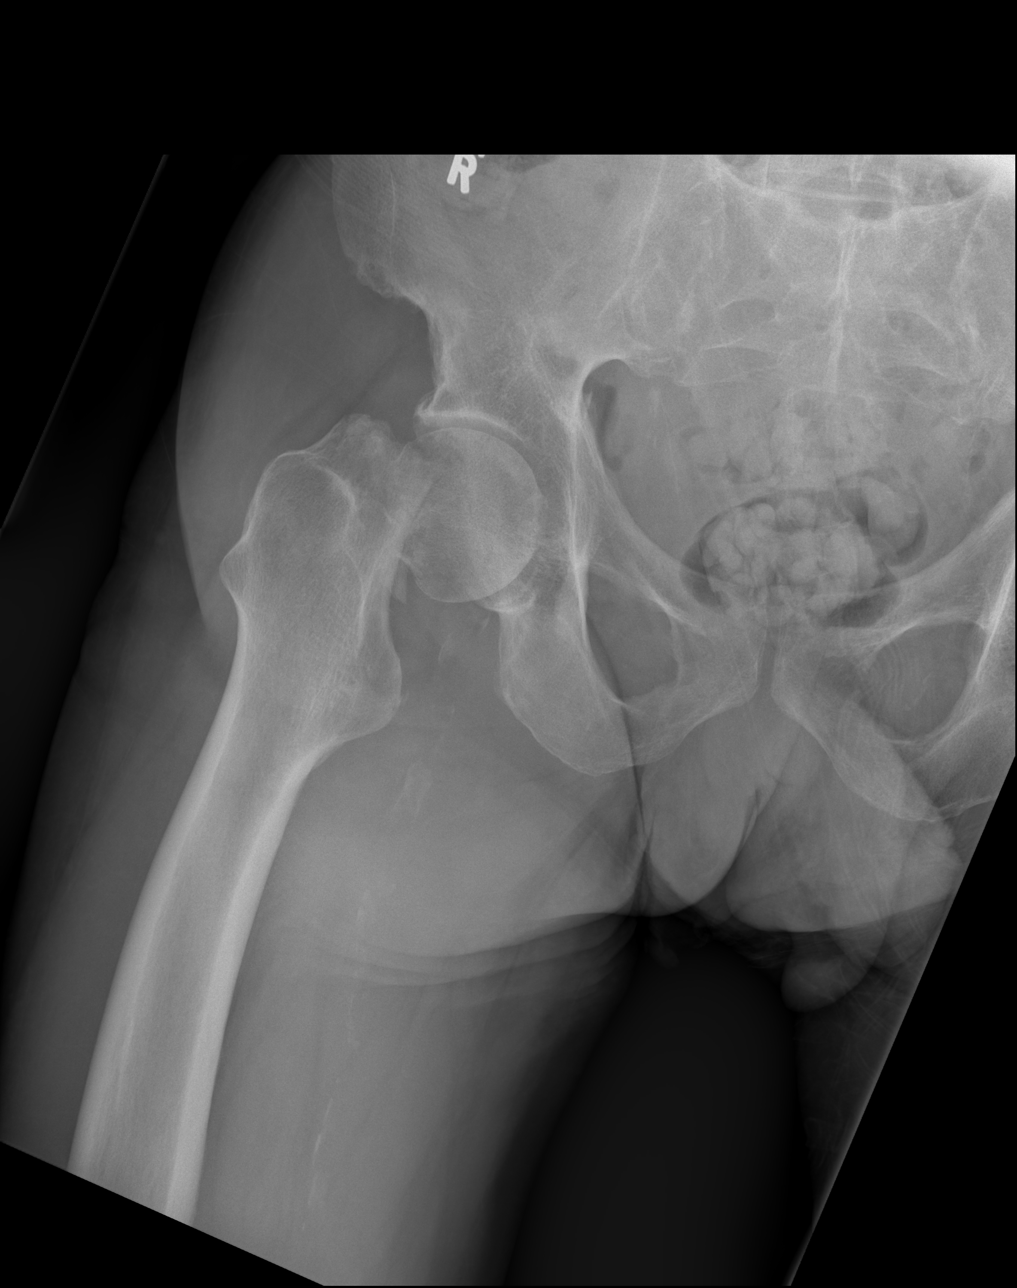
[im 2/4]
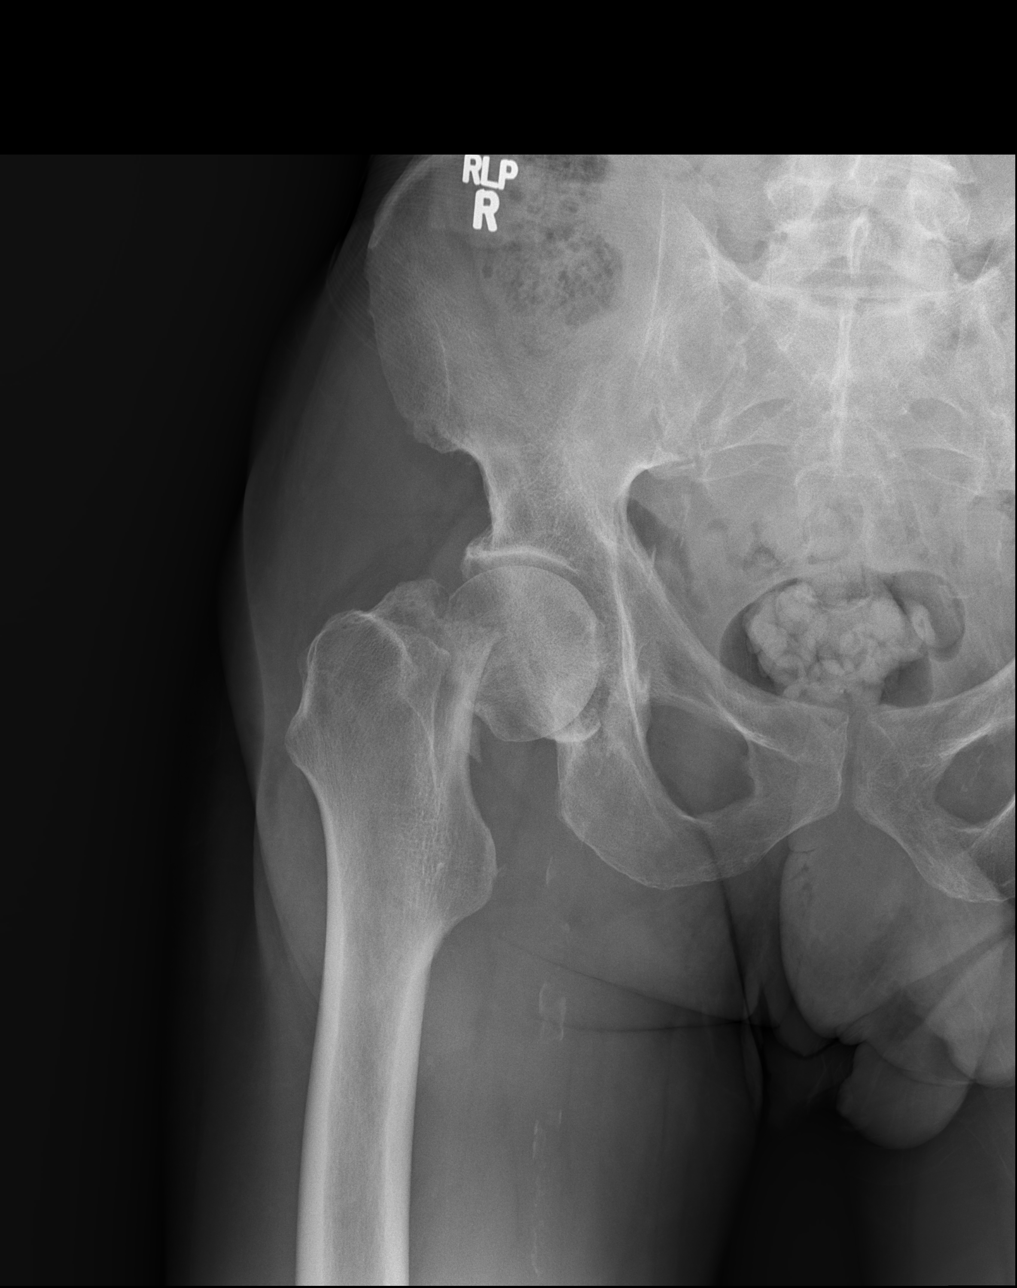
[im 3/4]
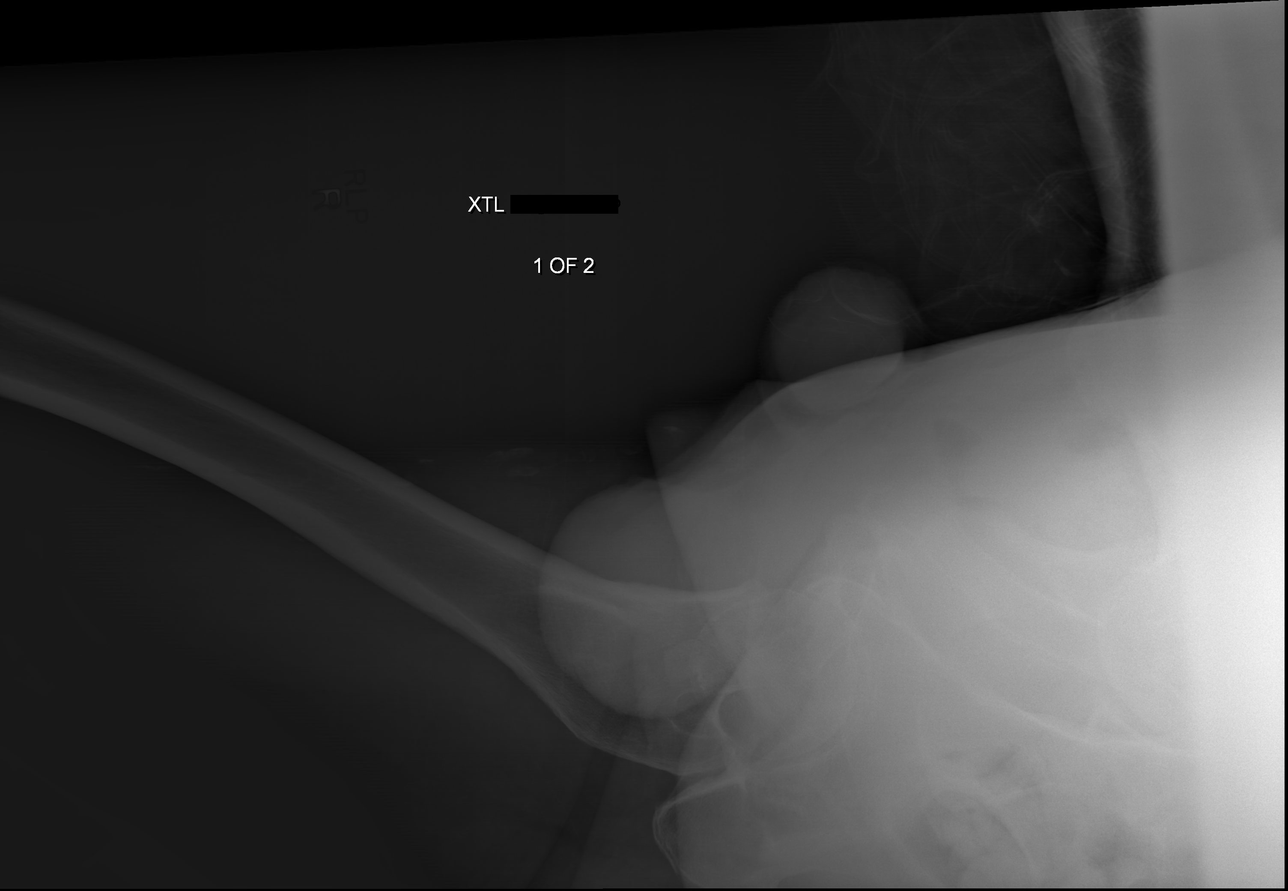
[im 4/4]
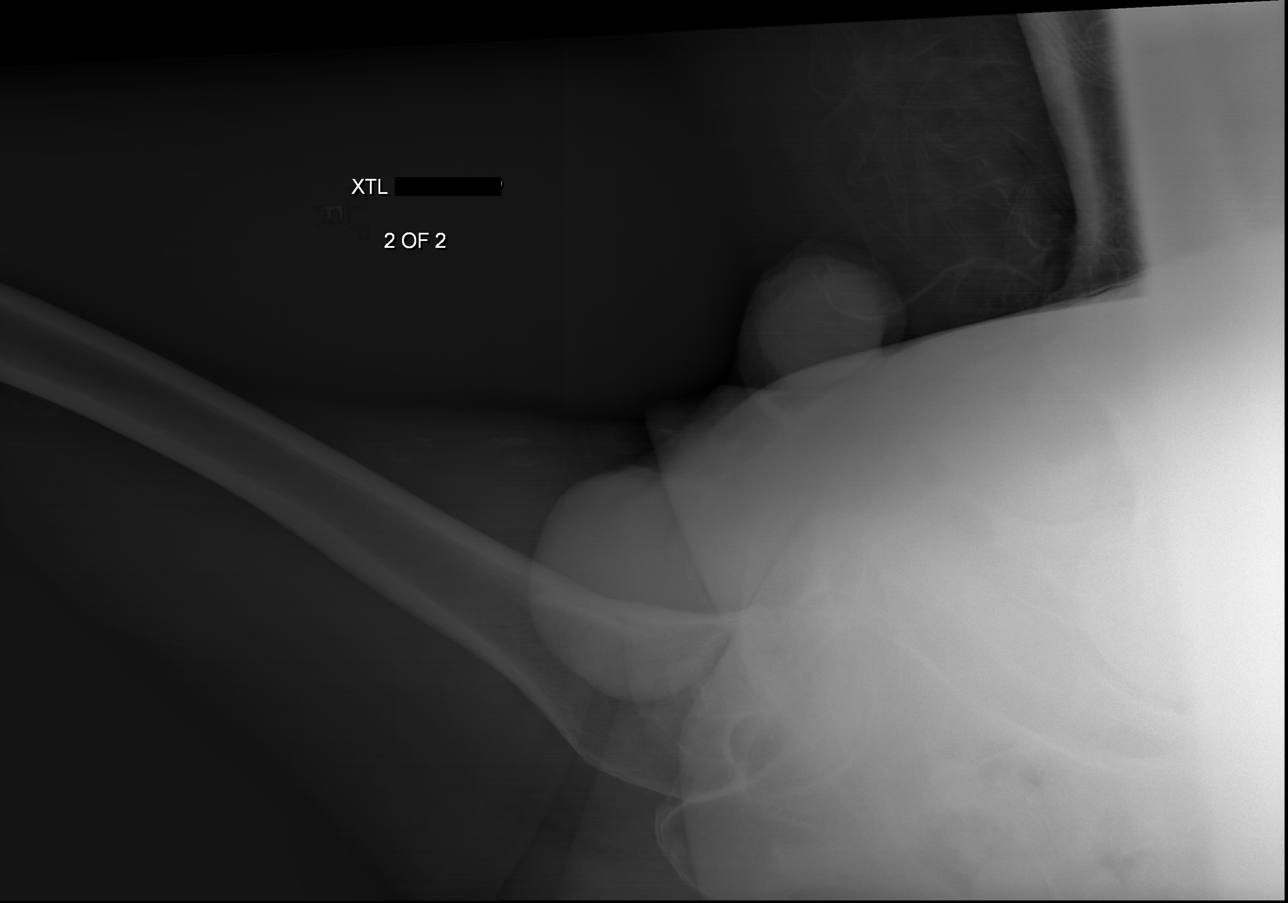

[4 of 4 positions shown; findings below may reference images not displayed]

IMPRESSION: Subcapital right femoral fracture with displacement as
described.

[REDACTED]

## 2014-11-28 ENCOUNTER — Emergency Department: Payer: 59

## 2014-11-28 ENCOUNTER — Inpatient Hospital Stay (HOSPITAL_COMMUNITY)
Admission: AD | Admit: 2014-11-28 | Discharge: 2014-12-14 | DRG: 064 | Disposition: E | Payer: 59 | Source: Other Acute Inpatient Hospital | Attending: Neurology | Admitting: Neurology

## 2014-11-28 ENCOUNTER — Emergency Department
Admission: EM | Admit: 2014-11-28 | Discharge: 2014-11-28 | Disposition: A | Discharge status: Other Acute Inpatient Hospital | Payer: 59 | Attending: Emergency Medicine | Admitting: Emergency Medicine

## 2014-11-28 DIAGNOSIS — E119 Type 2 diabetes mellitus without complications: Secondary | ICD-10-CM | POA: Insufficient documentation

## 2014-11-28 DIAGNOSIS — Z794 Long term (current) use of insulin: Secondary | ICD-10-CM

## 2014-11-28 DIAGNOSIS — R4701 Aphasia: Secondary | ICD-10-CM | POA: Diagnosis present

## 2014-11-28 DIAGNOSIS — I618 Other nontraumatic intracerebral hemorrhage: Secondary | ICD-10-CM | POA: Insufficient documentation

## 2014-11-28 DIAGNOSIS — I161 Hypertensive emergency: Secondary | ICD-10-CM | POA: Insufficient documentation

## 2014-11-28 DIAGNOSIS — I619 Nontraumatic intracerebral hemorrhage, unspecified: Secondary | ICD-10-CM | POA: Diagnosis present

## 2014-11-28 DIAGNOSIS — R400 Somnolence: Secondary | ICD-10-CM | POA: Diagnosis present

## 2014-11-28 DIAGNOSIS — G935 Compression of brain: Secondary | ICD-10-CM | POA: Diagnosis present

## 2014-11-28 DIAGNOSIS — Z86718 Personal history of other venous thrombosis and embolism: Secondary | ICD-10-CM

## 2014-11-28 DIAGNOSIS — N179 Acute kidney failure, unspecified: Secondary | ICD-10-CM | POA: Diagnosis present

## 2014-11-28 DIAGNOSIS — I639 Cerebral infarction, unspecified: Secondary | ICD-10-CM | POA: Diagnosis not present

## 2014-11-28 DIAGNOSIS — G8191 Hemiplegia, unspecified affecting right dominant side: Secondary | ICD-10-CM | POA: Diagnosis present

## 2014-11-28 DIAGNOSIS — R4182 Altered mental status, unspecified: Secondary | ICD-10-CM | POA: Diagnosis present

## 2014-11-28 DIAGNOSIS — I10 Essential (primary) hypertension: Secondary | ICD-10-CM | POA: Diagnosis present

## 2014-11-28 DIAGNOSIS — R29810 Facial weakness: Secondary | ICD-10-CM | POA: Diagnosis present

## 2014-11-28 DIAGNOSIS — I610 Nontraumatic intracerebral hemorrhage in hemisphere, subcortical: Secondary | ICD-10-CM

## 2014-11-28 DIAGNOSIS — G936 Cerebral edema: Secondary | ICD-10-CM | POA: Diagnosis not present

## 2014-11-28 DIAGNOSIS — Z66 Do not resuscitate: Secondary | ICD-10-CM | POA: Diagnosis present

## 2014-11-28 DIAGNOSIS — I611 Nontraumatic intracerebral hemorrhage in hemisphere, cortical: Secondary | ICD-10-CM | POA: Diagnosis not present

## 2014-11-28 DIAGNOSIS — E1165 Type 2 diabetes mellitus with hyperglycemia: Secondary | ICD-10-CM | POA: Diagnosis present

## 2014-11-28 DIAGNOSIS — G40909 Epilepsy, unspecified, not intractable, without status epilepticus: Secondary | ICD-10-CM | POA: Diagnosis present

## 2014-11-28 DIAGNOSIS — Z720 Tobacco use: Secondary | ICD-10-CM | POA: Diagnosis not present

## 2014-11-28 DIAGNOSIS — J9600 Acute respiratory failure, unspecified whether with hypoxia or hypercapnia: Secondary | ICD-10-CM | POA: Insufficient documentation

## 2014-11-28 DIAGNOSIS — F17210 Nicotine dependence, cigarettes, uncomplicated: Secondary | ICD-10-CM | POA: Diagnosis present

## 2014-11-28 DIAGNOSIS — I629 Nontraumatic intracranial hemorrhage, unspecified: Secondary | ICD-10-CM

## 2014-11-28 DIAGNOSIS — Z515 Encounter for palliative care: Secondary | ICD-10-CM | POA: Diagnosis not present

## 2014-11-28 HISTORY — DX: Essential (primary) hypertension: I10

## 2014-11-28 HISTORY — DX: Unspecified convulsions: R56.9

## 2014-11-28 HISTORY — DX: Personal history of other venous thrombosis and embolism: Z86.718

## 2014-11-28 HISTORY — DX: Type 2 diabetes mellitus without complications: E11.9

## 2014-11-28 LAB — CBC
HEMATOCRIT: 38.5 % — AB (ref 40.0–52.0)
Hemoglobin: 12.8 g/dL — ABNORMAL LOW (ref 13.0–18.0)
MCH: 30.1 pg (ref 26.0–34.0)
MCHC: 33.2 g/dL (ref 32.0–36.0)
MCV: 90.7 fL (ref 80.0–100.0)
PLATELETS: 180 10*3/uL (ref 150–440)
RBC: 4.24 MIL/uL — ABNORMAL LOW (ref 4.40–5.90)
RDW: 16.4 % — ABNORMAL HIGH (ref 11.5–14.5)
WBC: 5.1 10*3/uL (ref 3.8–10.6)

## 2014-11-28 LAB — COMPREHENSIVE METABOLIC PANEL
ALBUMIN: 3.1 g/dL — AB (ref 3.5–5.0)
ALK PHOS: 110 U/L (ref 38–126)
ALT: 24 U/L (ref 17–63)
AST: 28 U/L (ref 15–41)
Anion gap: 8 (ref 5–15)
BUN: 30 mg/dL — ABNORMAL HIGH (ref 6–20)
CO2: 22 mmol/L (ref 22–32)
Calcium: 8.3 mg/dL — ABNORMAL LOW (ref 8.9–10.3)
Chloride: 103 mmol/L (ref 101–111)
Creatinine, Ser: 1.18 mg/dL (ref 0.61–1.24)
GFR calc Af Amer: >60 mL/min (ref >60)
GFR calc non Af Amer: >60 mL/min (ref >60)
GLUCOSE: 330 mg/dL — AB (ref 65–99)
POTASSIUM: 4.4 mmol/L (ref 3.5–5.1)
Sodium: 133 mmol/L — ABNORMAL LOW (ref 135–145)
TOTAL PROTEIN: 6.2 g/dL — AB (ref 6.5–8.1)
Total Bilirubin: 0.5 mg/dL (ref 0.3–1.2)

## 2014-11-28 LAB — URINALYSIS COMPLETE WITH MICROSCOPIC (ARMC ONLY)
Bilirubin Urine: NEGATIVE
Glucose, UA: >500 mg/dL — AB
Leukocytes, UA: NEGATIVE
Nitrite: NEGATIVE
PH: 5 (ref 5.0–8.0)
SPECIFIC GRAVITY, URINE: 1.012 (ref 1.005–1.030)

## 2014-11-28 LAB — BLOOD GAS, ARTERIAL
Acid-base deficit: 8.2 mmol/L — ABNORMAL HIGH (ref 0.0–2.0)
Acid-base deficit: 3.8 mmol/L — ABNORMAL HIGH (ref 0.0–2.0)
Allens test (pass/fail): POSITIVE — AB
Bicarbonate: 19.6 mEq/L — ABNORMAL LOW (ref 21.0–28.0)
Bicarbonate: 20.8 mEq/L (ref 20.0–24.0)
Drawn by: 28340
FIO2: 100 %
FIO2: 0.6 %
MECHVT: 640 mL
O2 SAT: 99.5 %
PCO2 ART: 48 mmHg (ref 32.0–48.0)
PCO2 ART: 38.4 mmHg (ref 35.0–45.0)
PEEP/CPAP: 5 cmH2O
PEEP: 5 cmH2O
PO2 ART: 176 mmHg — AB (ref 80.0–100.0)
Patient temperature: 37
Patient temperature: 98.6
RATE: 16 resp/min
RATE: 14 resp/min
TCO2: 22 mmol/L (ref 0–100)
VT: 550 mL
pH, Arterial: 7.22 — ABNORMAL LOW (ref 7.350–7.450)
pH, Arterial: 7.354 (ref 7.350–7.450)
pO2, Arterial: 599 mmHg — ABNORMAL HIGH (ref 83.0–108.0)

## 2014-11-28 LAB — DIFFERENTIAL
Basophils Absolute: 0.1 10*3/uL (ref 0–0.1)
Basophils Relative: 1 %
EOS ABS: 0 10*3/uL (ref 0–0.7)
Eosinophils Relative: 0 %
LYMPHS ABS: 0.6 10*3/uL — AB (ref 1.0–3.6)
LYMPHS PCT: 11 %
MONO ABS: 0.3 10*3/uL (ref 0.2–1.0)
Monocytes Relative: 5 %
Neutro Abs: 4.2 10*3/uL (ref 1.4–6.5)
Neutrophils Relative %: 83 %

## 2014-11-28 LAB — PROTIME-INR
INR: 1.09
Prothrombin Time: 14.3 seconds (ref 11.4–15.0)

## 2014-11-28 LAB — APTT: APTT: 28 s (ref 24–36)

## 2014-11-28 LAB — GLUCOSE, CAPILLARY: GLUCOSE-CAPILLARY: 347 mg/dL — AB (ref 65–99)

## 2014-11-28 LAB — MRSA PCR SCREENING: MRSA by PCR: NEGATIVE

## 2014-11-28 LAB — TROPONIN I

## 2014-11-28 MED ORDER — STROKE: EARLY STAGES OF RECOVERY BOOK
Freq: Once | Status: AC
  Administered 2014-11-29
  Filled 2014-11-28: qty 1

## 2014-11-28 MED ORDER — ETOMIDATE 2 MG/ML IV SOLN
20.0000 mg | Freq: Once | INTRAVENOUS | Status: AC
  Administered 2014-11-28: 20 mg via INTRAVENOUS

## 2014-11-28 MED ORDER — SENNOSIDES-DOCUSATE SODIUM 8.6-50 MG PO TABS
1.0000 | ORAL_TABLET | Freq: Two times a day (BID) | ORAL | Status: DC
  Filled 2014-11-28 (×5): qty 1

## 2014-11-28 MED ORDER — NICARDIPINE HCL IN NACL 20-0.86 MG/200ML-% IV SOLN
3.0000 mg/h | INTRAVENOUS | Status: DC
  Administered 2014-11-28: 5 mg/h via INTRAVENOUS
  Administered 2014-11-29: 2.5 mg/h via INTRAVENOUS
  Administered 2014-11-29: 2 mg/h via INTRAVENOUS
  Administered 2014-11-29 – 2014-11-30 (×2): 3 mg/h via INTRAVENOUS
  Administered 2014-11-30: 5 mg/h via INTRAVENOUS
  Administered 2014-11-30: 7.5 mg/h via INTRAVENOUS
  Filled 2014-11-28 (×6): qty 200

## 2014-11-28 MED ORDER — MANNITOL 20 % IV SOLN
69.6000 g | Freq: Once | INTRAVENOUS | Status: AC
  Administered 2014-11-28: 69.6 g via INTRAVENOUS
  Filled 2014-11-28: qty 500

## 2014-11-28 MED ORDER — MIDAZOLAM HCL 5 MG/5ML IJ SOLN
3.0000 mg | Freq: Once | INTRAMUSCULAR | Status: AC
  Administered 2014-11-28: 3 mg via INTRAVENOUS

## 2014-11-28 MED ORDER — SODIUM CHLORIDE 0.9 % IV SOLN: 2.0000 mg/h | INTRAVENOUS | Status: DC

## 2014-11-28 MED ORDER — NICARDIPINE HCL IN NACL 20-0.86 MG/200ML-% IV SOLN
5.0000 mg/h | INTRAVENOUS | Status: DC
  Administered 2014-11-28: 5 mg/h via INTRAVENOUS
  Filled 2014-11-28: qty 200

## 2014-11-28 MED ORDER — ETOMIDATE 2 MG/ML IV SOLN: 10.0000 mg | Freq: Once | INTRAVENOUS | Status: DC

## 2014-11-28 MED ORDER — SUCCINYLCHOLINE CHLORIDE 20 MG/ML IJ SOLN
100.0000 mg | Freq: Once | INTRAMUSCULAR | Status: AC
  Administered 2014-11-28: 100 mg via INTRAVENOUS

## 2014-11-28 MED ORDER — ACETAMINOPHEN 650 MG RE SUPP: 650.0000 mg | RECTAL | Status: DC | PRN

## 2014-11-28 MED ORDER — SODIUM CHLORIDE 0.9 % IV SOLN
1000.0000 mg | Freq: Once | INTRAVENOUS | Status: AC
  Administered 2014-11-28: 1000 mg via INTRAVENOUS
  Filled 2014-11-28: qty 10

## 2014-11-28 MED ORDER — PROPOFOL 1000 MG/100ML IV EMUL
5.0000 ug/kg/min | Freq: Once | INTRAVENOUS | Status: AC
  Administered 2014-11-28: 14.501 ug/kg/min via INTRAVENOUS
  Filled 2014-11-28: qty 100

## 2014-11-28 MED ORDER — ACETAMINOPHEN 325 MG PO TABS: 650.0000 mg | ORAL_TABLET | ORAL | Status: DC | PRN

## 2014-11-28 MED ORDER — MANNITOL 20 % IV SOLN
1.0000 g/kg | INTRAVENOUS | Status: DC
  Filled 2014-11-28: qty 500

## 2014-11-28 MED ORDER — SODIUM CHLORIDE 0.9 % IV SOLN
500.0000 mg | Freq: Two times a day (BID) | INTRAVENOUS | Status: DC
  Administered 2014-11-29 – 2014-11-30 (×4): 500 mg via INTRAVENOUS
  Filled 2014-11-28 (×5): qty 5

## 2014-11-28 MED ORDER — FENTANYL CITRATE (PF) 100 MCG/2ML IJ SOLN: 50.0000 ug | INTRAMUSCULAR | Status: DC | PRN

## 2014-11-28 MED ORDER — MANNITOL 25 % IV SOLN
17.4000 g | Freq: Four times a day (QID) | INTRAVENOUS | Status: DC
  Administered 2014-11-29: 17.4 g via INTRAVENOUS
  Filled 2014-11-28: qty 50
  Filled 2014-11-28 (×2): qty 69.6
  Filled 2014-11-28: qty 50
  Filled 2014-11-28 (×3): qty 69.6

## 2014-11-28 MED ORDER — SODIUM CHLORIDE 0.9 % IV SOLN
INTRAVENOUS | Status: DC
  Administered 2014-11-28 – 2014-11-29 (×2): via INTRAVENOUS

## 2014-11-28 MED ORDER — MIDAZOLAM HCL 5 MG/5ML IJ SOLN
2.0000 mg | Freq: Once | INTRAMUSCULAR | Status: AC
  Administered 2014-11-28: 2 mg via INTRAVENOUS

## 2014-11-28 MED ORDER — SUCCINYLCHOLINE CHLORIDE 20 MG/ML IJ SOLN: 100.0000 mg | Freq: Once | INTRAMUSCULAR | Status: DC

## 2014-11-28 MED ORDER — PANTOPRAZOLE SODIUM 40 MG IV SOLR
40.0000 mg | Freq: Every day | INTRAVENOUS | Status: DC
  Administered 2014-11-28 – 2014-11-29 (×2): 40 mg via INTRAVENOUS
  Filled 2014-11-28 (×3): qty 40

## 2014-11-28 MED ORDER — MANNITOL 20 % IV SOLN: 0.2500 g/kg | Freq: Four times a day (QID) | INTRAVENOUS | Status: DC

## 2014-11-28 MED ORDER — LORAZEPAM 2 MG/ML IJ SOLN: 2.0000 mg | INTRAMUSCULAR | Status: DC | PRN

## 2014-11-28 MED ORDER — INSULIN ASPART 100 UNIT/ML ~~LOC~~ SOLN
0.0000 [IU] | SUBCUTANEOUS | Status: DC
  Administered 2014-11-29: 11 [IU] via SUBCUTANEOUS
  Administered 2014-11-29: 5 [IU] via SUBCUTANEOUS
  Administered 2014-11-29: 3 [IU] via SUBCUTANEOUS
  Administered 2014-11-29: 5 [IU] via SUBCUTANEOUS
  Administered 2014-11-29 – 2014-11-30 (×2): 3 [IU] via SUBCUTANEOUS
  Administered 2014-11-30 (×2): 5 [IU] via SUBCUTANEOUS

## 2014-11-28 MED ORDER — SODIUM CHLORIDE 0.9 % IV SOLN
INTRAVENOUS | Status: DC
  Filled 2014-11-28: qty 2.5

## 2014-11-28 MED ORDER — LABETALOL HCL 5 MG/ML IV SOLN
10.0000 mg | INTRAVENOUS | Status: DC | PRN
  Filled 2014-11-28: qty 8
  Filled 2014-11-28: qty 4

## 2014-11-28 NOTE — ED Provider Notes (Signed)
[facility] Emergency Department Provider Note     Time seen: ----------------------------------------- 4:52 PM on 11/18/2014 -----------------------------------------    I have reviewed the triage vital signs and the nursing notes. Level V caveat: Review of systems and history cannot be obtained, patient is obtunded  HISTORY  Chief Complaint Altered Mental Status and Code Stroke    HPI Andrew Dillon is a 64 y.o. male brought to ER being unresponsive only to pain. He was last seen by his wife at approximately 8:00 this morning. States she went to work and when she returned home she found him unresponsive on the floor. At that time he was able to roll himself over towards his right side, he did seem to move his left arm. On arrival herepatient has a left fixed gaze preference, will move his left arm purposefully but not to commands, does not move his right arm right leg or left leg.   Past Medical History  Diagnosis Date  . Diabetes mellitus without complication   . Seizures     There are no active problems to display for this patient.   History reviewed. No pertinent past surgical history.  Allergies Review of patient's allergies indicates no known allergies.  Social History History  Substance Use Topics  . Smoking status: Not on file  . Smokeless tobacco: Not on file  . Alcohol Use: No    Review of Systems Unknown review of systems    ____________________________________________   PHYSICAL EXAM:  VITAL SIGNS: ED Triage Vitals  Enc Vitals Group     BP 11/27/2014 1644 154/99 mmHg     Pulse Rate 11/24/2014 1644 67     Resp 11/26/2014 1644 16     Temp --      Temp src --      SpO2 11/27/2014 1644 99 %     Weight 11/30/2014 1644 195 lb (88.451 kg)     Height 11/16/2014 1644 6\' 1"  (1.854 m)     Head Cir --      Peak Flow --      Pain Score --      Pain Loc --      Pain Edu? --      Excl. in GC? --     Constitutional: Patient is not  alert, does not respond to commands. Patient has a clenched jaw. Cannot assess airway Eyes: Patient is a left upward gaze preference, pupils are 3 mm equal bilateral. ENT   Head: Normocephalic and atraumatic.   Nose: No congestion/rhinnorhea.   Mouth/Throat: Mucous membranes are moist. Jaw is clenched   Neck: No stridor. Hematological/Lymphatic/Immunilogical: No cervical lymphadenopathy. Cardiovascular: Normal rate, regular rhythm. Normal and symmetric distal pulses are present in all extremities. No murmurs, rubs, or gallops. Respiratory: Normal respiratory effort without tachypnea nor retractions. Breath sounds are clear and equal bilaterally. No wheezes/rales/rhonchi. Gastrointestinal: Soft. No distention. There is no CVA tenderness. Musculoskeletal: Patient is only moving his left arm with normal range of motion, other joints and limbs cannot be assessed. Neurologic:  Patient with apparent right-sided hemiparesis, leftward upper gaze preference. GCS is E2, V1, M4 with a GCS of 7. Skin:  Skin is warm, dry and intact. No rash noted. ____________________________________________  EKG: Interpreted by me. Normal sinus rhythm with a rate of 69 bpm, market sinus arrhythmia. Septal infarct age indeterminate. Normal axis. No evidence of perjury fever or acute infarction.  ____________________________________________  ED COURSE:  Pertinent labs & imaging results that were available during my  care of the patient were reviewed by me and considered in my medical decision making (see chart for details). Patient with evidence of acute stroke, unclear if ischemic or hemorrhagic. Patient will need RSI for airway protection, then patient will need emergent CT.  INTUBATION Performed by: Daryel NovemberWilliams, Jonathan E  Required items: required blood products, implants, devices, and special equipment available Patient identity confirmed: provided demographic data and hospital-assigned identification  number Time out: Immediately prior to procedure a "time out" was called to verify the correct patient, procedure, equipment, support staff and site/side marked as required.  Indications: Unresponsive, unclear airway management   Intubation method: Glidescope Laryngoscopy   Preoxygenation: BVM  Sedatives: 20 mg Etomidate Paralytic: 100 mg Succinylcholine  Tube Size: 8.0 cuffed  Post-procedure assessment: chest rise and ETCO2 monitor Breath sounds: equal and absent over the epigastrium Tube secured with: ETT holder Chest x-ray interpreted by radiologist and me.  Chest x-ray findings: 8.0 endotracheal tube in appropriate position  Patient tolerated the procedure well with no immediate complications.     ____________________________________________    LABS (pertinent positives/negatives)  Labs Reviewed  CBC - Abnormal; Notable for the following:    RBC 4.24 (*)    Hemoglobin 12.8 (*)    HCT 38.5 (*)    RDW 16.4 (*)    All other components within normal limits  DIFFERENTIAL - Abnormal; Notable for the following:    Lymphs Abs 0.6 (*)    All other components within normal limits  COMPREHENSIVE METABOLIC PANEL - Abnormal; Notable for the following:    Sodium 133 (*)    Glucose, Bld 330 (*)    BUN 30 (*)    Calcium 8.3 (*)    Total Protein 6.2 (*)    Albumin 3.1 (*)    All other components within normal limits  BLOOD GAS, ARTERIAL - Abnormal; Notable for the following:    pH, Arterial 7.22 (*)    pO2, Arterial 599 (*)    Bicarbonate 19.6 (*)    Acid-base deficit 8.2 (*)    Allens test (pass/fail) POSITIVE (*)    All other components within normal limits  PROTIME-INR  APTT  TROPONIN I  LACTIC ACID, PLASMA  LACTIC ACID, PLASMA  URINALYSIS COMPLETEWITH MICROSCOPIC ([facility] ONLY)  CBG MONITORING, ED   CRITICAL CARE Performed by: Emily FilbertWilliams, Jonathan E   Total critical care time: 30 minutes  Critical care time was exclusive of separately billable procedures and  treating other patients.  Critical care was necessary to treat or prevent imminent or life-threatening deterioration.  Critical care was time spent personally by me on the following activities: development of treatment plan with patient and/or surrogate as well as nursing, discussions with consultants, evaluation of patient's response to treatment, examination of patient, obtaining history from patient or surrogate, ordering and performing treatments and interventions, ordering and review of laboratory studies, ordering and review of radiographic studies, pulse oximetry and re-evaluation of patient's condition.   RADIOLOGY Images were viewed by me  Chest x-ray IMPRESSION: Endotracheal tube terminates 6 cm above the carina. CT head IMPRESSION: Large left basal ganglia intraparenchymal hemorrhage with extension into the lateral ventricles. There is mass-effect approximately 1.5 cm left-to-right midline shift.  No acute cervical spine fracture.  Critical Value/emergent results were called by telephone at the time of interpretation on 2014/05/26 at 6:01 pm to Dr. Daryel NovemberJONATHAN WILLIAMS , who verbally acknowledged these results.  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 Patient will require transfer to tertiary care facility. Patient is on xarelto, will get Kcentra. Patient will continue on propofol drip, discussed with specialist on call neurology who recommended maintain blood pressure to less than 140. Patient remains critically ill, with poor prognosis. The wife is unable to be contacted to discuss his care with.   Emily Filbert, MD   Emily Filbert, MD 19-Dec-2014 Rickey Primus

## 2014-11-28 NOTE — Consult Note (Signed)
PULMONARY / CRITICAL CARE MEDICINE   Name: Andrew Dillon MRN: [id number] DOB: 12-26-1950    ADMISSION DATE:  12/01/2014 CONSULTATION DATE:  12/10/2014  REFERRING MD :  Cyril Mourning, Neurology  CHIEF COMPLAINT:  Ventilator management  INITIAL PRESENTATION: 64 year old who with massive left intracerebral hemorrhage requiring mechanical ventilation  STUDIES:  Head CT 7/16 >> large left basal ganglia bleed with extension into the lateral ventricles, compression of the third ventricle, 1.5 cm midline shift  SIGNIFICANT EVENTS: 7/16 ETT >>   HISTORY OF PRESENT ILLNESS:  64 year old transferred from [facility] ED, he is unresponsive so history obtained after reviewing the chart. He was last seen by his wife at 8 AM this morning, she found him unresponsive on the floor when she returned from work. Head CT showed a large left pain bleed at [facility] ED. He was intubated for airway protection and transferred to [facility] for further care. He was on [employer], transported on propofol drip. He was also placed on Cardene drip for hypertension. MAP on arrival to [facility] ED was 150  PAST MEDICAL HISTORY :   has a past medical history of Diabetes mellitus without complication and Seizures.  has no past surgical history on file. Prior to Admission medications   Not on File   Allergies  Allergen Reactions  . Metformin And Related     FAMILY HISTORY:  has no family status information on file.  SOCIAL HISTORY:  reports that he does not drink alcohol.  REVIEW OF SYSTEMS:  Unable to obtain since unresponsive  SUBJECTIVE:   VITAL SIGNS: Temp:  [98.4 F (36.9 C)-98.9 F (37.2 C)] 98.4 F (36.9 C) (07/16 2130) Pulse Rate:  [67-105] 101 (07/16 2330) Resp:  [14-25] 16 (07/16 2330) BP: (121-236)/(69-118) 122/69 mmHg (07/16 2330) SpO2:  [95 %-100 %] 97 % (07/16 2330) FiO2 (%):  [40 %-100 %] 40 % (07/16 2321) Weight:  [153 lb 7 oz (69.6 kg)-195 lb (88.451 kg)] 153 lb 7 oz (69.6 kg) (07/16  2130) HEMODYNAMICS:   VENTILATOR SETTINGS: Vent Mode:  [-] PRVC FiO2 (%):  [40 %-100 %] 40 % Set Rate:  [14 bmp-16 bmp] 14 bmp Vt Set:  [550 mL-640 mL] 640 mL PEEP:  [5 cmH20] 5 cmH20 Plateau Pressure:  [16 cmH20-21 cmH20] 21 cmH20 INTAKE / OUTPUT: No intake or output data in the 24 hours ending 12/06/2014 2344  PHYSICAL EXAMINATION: General:  Acutely ill, orally intubated, unresponsive ENT - no lesions, no post nasal drip, left gaze preference, nystagmus + Neck: No JVD, no thyromegaly, no carotid bruits, cervical collar Lungs: no use of accessory muscles, no dullness to percussion, clear without rales or rhonchi  Cardiovascular: Rhythm regular, heart sounds  normal, no murmurs, no peripheral edema Abdomen: soft and non-tender, no hepatosplenomegaly, BS normal. Musculoskeletal: No deformities, no cyanosis or clubbing Neuro:  Unresponsive, was left upper extremity, could not elicit plantars Skin:  Warm, no lesions/ rash   LABS:  CBC  Recent Labs Lab December 02, 2014 1639  WBC 5.1  HGB 12.8*  HCT 38.5*  PLT 180   Coag's  Recent Labs Lab 11/30/2014 1639  APTT 28  INR 1.09   BMET  Recent Labs Lab 12/04/2014 1639  NA 133*  K 4.4  CL 103  CO2 22  BUN 30*  CREATININE 1.18  GLUCOSE 330*   Electrolytes  Recent Labs Lab 11/23/2014 1639  CALCIUM 8.3*   Sepsis Markers No results for input(s): LATICACIDVEN, PROCALCITON, O2SATVEN in the last 168 hours. ABG  Recent Labs Lab  04/18/2015 1725 04/18/2015 2320  PHART 7.22* 7.354  PCO2ART 48 38.4  PO2ART 599* 176*   Liver Enzymes  Recent Labs Lab 04/18/2015 1639  AST 28  ALT 24  ALKPHOS 110  BILITOT 0.5  ALBUMIN 3.1*   Cardiac Enzymes  Recent Labs Lab 04/18/2015 1639  TROPONINI <0.03   Glucose  Recent Labs Lab 04/18/2015 1643  GLUCAP 347*    Imaging Dg Chest 1 View  Nov 12, 2014   CLINICAL DATA:  Unresponsive, intubated  EXAM: CHEST  1 VIEW  COMPARISON:  02/04/2013  FINDINGS: Lungs are clear.  No pleural  effusion or pneumothorax.  Endotracheal tube terminates 6 cm above the carina.  The heart is normal in size.  IMPRESSION: Endotracheal tube terminates 6 cm above the carina.   Electronically Signed   By: Charline BillsSriyesh  Krishnan M.D.   On: 0Jun 30, 2016 17:44   Ct Head Wo Contrast  Nov 12, 2014   CLINICAL DATA:  64 year old male unresponsive  EXAM: CT HEAD WITHOUT CONTRAST  CT CERVICAL SPINE WITHOUT CONTRAST  TECHNIQUE: Multidetector CT imaging of the head and cervical spine was performed following the standard protocol without intravenous contrast. Multiplanar CT image reconstructions of the cervical spine were also generated.  COMPARISON:  Head CT dated 11/28/2013  FINDINGS: CT HEAD FINDINGS  There is a large left basal ganglia intraparenchymal hemorrhage measuring approximately 8.5 x 5.1 cm in axial dimensions. There is extension of the clot into the lateral ventricles. There is compression of the third ventricle. The fourth ventricle is clear. There is mass effect and 1.5 cm left to right midline shift. There is mild effacement of the left quadrigeminal plate cisterns. Mild periventricular and deep white matter hypodensities represent chronic microvascular ischemic changes. There is mild mucoperiosteal thickening of the paranasal sinuses. The mastoid air cells are well aerated. The calvarium is intact.  CT CERVICAL SPINE FINDINGS  There is no acute fracture or subluxation of the cervical spine.Multilevel degenerative changes. There is reversal of normal cervical lordosis at C4-C6 which may be positional or due to muscle spasm.The odontoid and spinous processes are intact.There is normal anatomic alignment of the C1-C2 lateral masses. The visualized soft tissues appear unremarkable.  An endotracheal tube is partially visualized.  IMPRESSION: Large left basal ganglia intraparenchymal hemorrhage with extension into the lateral ventricles. There is mass-effect approximately 1.5 cm left-to-right midline shift.  No acute  cervical spine fracture.  Critical Value/emergent results were called by telephone at the time of interpretation on Nov 12, 2014 at 6:01 pm to Dr. Daryel NovemberJONATHAN WILLIAMS , who verbally acknowledged these results.   Electronically Signed   By: Elgie CollardArash  Radparvar M.D.   On: 0Jun 30, 2016 18:12   Ct Cervical Spine Wo Contrast  Nov 12, 2014   CLINICAL DATA:  64 year old male unresponsive  EXAM: CT HEAD WITHOUT CONTRAST  CT CERVICAL SPINE WITHOUT CONTRAST  TECHNIQUE: Multidetector CT imaging of the head and cervical spine was performed following the standard protocol without intravenous contrast. Multiplanar CT image reconstructions of the cervical spine were also generated.  COMPARISON:  Head CT dated 11/28/2013  FINDINGS: CT HEAD FINDINGS  There is a large left basal ganglia intraparenchymal hemorrhage measuring approximately 8.5 x 5.1 cm in axial dimensions. There is extension of the clot into the lateral ventricles. There is compression of the third ventricle. The fourth ventricle is clear. There is mass effect and 1.5 cm left to right midline shift. There is mild effacement of the left quadrigeminal plate cisterns. Mild periventricular and deep white matter hypodensities represent chronic microvascular ischemic changes. There  is mild mucoperiosteal thickening of the paranasal sinuses. The mastoid air cells are well aerated. The calvarium is intact.  CT CERVICAL SPINE FINDINGS  There is no acute fracture or subluxation of the cervical spine.Multilevel degenerative changes. There is reversal of normal cervical lordosis at C4-C6 which may be positional or due to muscle spasm.The odontoid and spinous processes are intact.There is normal anatomic alignment of the C1-C2 lateral masses. The visualized soft tissues appear unremarkable.  An endotracheal tube is partially visualized.  IMPRESSION: Large left basal ganglia intraparenchymal hemorrhage with extension into the lateral ventricles. There is mass-effect approximately 1.5 cm  left-to-right midline shift.  No acute cervical spine fracture.  Critical Value/emergent results were called by telephone at the time of interpretation on 11/15/2014 at 6:01 pm to Dr. Daryel November , who verbally acknowledged these results.   Electronically Signed   By: Elgie Collard M.D.   On: 11/15/2014 18:12     ASSESSMENT / PLAN:  PULMONARY OETT 7/16 >> A: Acute respiratory failure due to poor GCS P:   Vent settings reviewed and adjusted ABG reviewed  CARDIOVASCULAR CVLnone A: Hypertensive emergency P:  Cardene drip with goal MAPP 110-120 or SBP 160-180  RENAL A:  At risk AKI P:   Isotonic fluids  GASTROINTESTINAL A:  No issues P:   Nothing by mouth Protonix  HEMATOLOGIC A:  History of DVT and Xarelto P: Treated with K Centra   INFECTIOUS A:  No issues P:     ENDOCRINE A:  Hyperglycemia,diabetes  2 P:   SSI Will not use insulin drip  NEUROLOGIC A:  Massive left ICH with shift P:   RASS goal: 0 Intermittent Fent  DC propofol Seizure prophylaxis per neurology-Keppra Mannitol with monitoring of osmolality  FAMILY  - Updates: Neurology discussed with wife, poor prognosis given  - Inter-disciplinary family meet or Palliative Care meeting due by:  7/23    TODAY'S SUMMARY: 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 Poor prognosis   The patient is critically ill with multiple organ systems failure and requires high complexity decision making for assessment and support, frequent evaluation and titration of therapies, application of advanced monitoring technologies and extensive interpretation of multiple databases. Critical Care Time devoted to patient care services described in this note independent of APP time is 35 minutes.   Cyril Mourning MD. Tonny Bollman.  Pulmonary & Critical care Pager [phone] If no response call [phone]    12/06/2014, 11:44 PM

## 2014-11-28 NOTE — ED Notes (Signed)
Pt with approx 1mm pupils at this time and very sluggish, no lateral gaze noted. No purposeful movment noted from pt. Dr. Mayford KnifeWilliams notified. Spouse remains at bedside. Spouse updated on possible transport time.

## 2014-11-28 NOTE — Consult Note (Signed)
Referring Physician: ED- [facility]    Chief Complaint: altered mental status, right hemiplegia, left ICH on CT brain  HPI:                                                                                                                                         Andrew Dillon is an 64 y.o. male with a past medical history significant for HTN, DM, DVT on xarelto, and predominantly nocturnal GTC seizures, transferred to [facility] for further management of left ICH. Patient is currently intubated on the vent, and her daughter and bother at the bedside are providing the pertinent clinical information. According to wife, she left the house around 8 am today and he was normal. Then, she went to work and she came back home he was unresponsive on the floor.  At that time he was able to roll himself over towards his right side, he did seem to move his left arm. EMS was summoned and upon arrival to [facility]ED he was noted to have a SBP>230. CT brain was urgently obtained and revealed a pretty large left basal ganglia intraparenchymal hemorrhage with extension into the lateral ventricles, with mass-effect approximately 1.5 cm left-to-right midline shift but no frank hydrocephalus. Patient was also treated with K centra as he has been on xarelto for DVT, and started on IV nicardipine infusion. Serologies significant for PTT 28, INR 1.09, platelets 273  Date last known well: 12/12/2014 Time last known well: 8 am tPA Given: no, ICH  Past Medical History  Diagnosis Date  . Diabetes mellitus without complication   . Seizures     No past surgical history on file.  No family history on file. Social History:  reports that he does not drink alcohol. His tobacco and drug histories are not on file.  Allergies:  Allergies  Allergen Reactions  . Metformin And Related     Medications:                                                                                                                           Scheduled: .   stroke: mapping our early stages of recovery book   Does not apply Once  . [START ON 11/29/2014] levETIRAcetam  500 mg Intravenous Q12H  . mannitol  69.6 g Intravenous Once  . [START ON 11/29/2014] mannitol  17.4 g Intravenous Q6H  .  pantoprazole (PROTONIX) IV  40 mg Intravenous QHS  . senna-docusate  1 tablet Oral BID    ROS: unable to obtain due to mental status                                                                                                                                      History obtained from family and chart review  Blood pressure 146/92, pulse 93, temperature 98.4 F (36.9 C), temperature source Oral, resp. rate 16, height _0  (1.854 m), weight 69.6 kg (153 lb 7 oz), SpO2 95 %. Physical exam: critically ill male, intubated on the vent.  Head: normocephalic. Neck: supple, no bruits, no JVD. Cardiac: no murmurs. Lungs: clear. Abdomen: soft, no tender, no mass. Extremities: no edema. Skin: no rash  Neurologic Examination:                                                                                                      Mental status: propofol was turned off and patient examined 5 minutes later, he is unresponsive to painful stimuli. CN 2-12: pupils 4 mm, poorly reactive. Left gaze preference. Corneal reflex present bilaterally. Tongue: intubated. Motor: dense right hemiplegia. Sensory: reacts to pain in teh left side. DTR's: 2 all over. Plantars: down. Coordination and gait: unable to test due to mental status.   Results for orders placed or performed during the hospital encounter of 12/10/2014 (from the past 48 hour(s))  Protime-INR     Status: None   Collection Time: 11/24/2014  4:39 PM  Result Value Ref Range   Prothrombin Time 14.3 11.4 - 15.0 seconds   INR 1.09   APTT     Status: None   Collection Time: 12/06/2014  4:39 PM  Result Value Ref Range   aPTT 28 24 - 36 seconds  CBC     Status: Abnormal   Collection Time: 11/29/2014  4:39 PM  Result Value  Ref Range   WBC 5.1 3.8 - 10.6 K/uL   RBC 4.24 (L) 4.40 - 5.90 MIL/uL   Hemoglobin 12.8 (L) 13.0 - 18.0 g/dL   HCT 38.5 (L) 40.0 - 52.0 %   MCV 90.7 80.0 - 100.0 fL   MCH 30.1 26.0 - 34.0 pg   MCHC 33.2 32.0 - 36.0 g/dL   RDW 16.4 (H) 11.5 - 14.5 %   Platelets 180 150 - 440 K/uL  Differential     Status: Abnormal   Collection Time: 11/16/2014  4:39 PM  Result Value  Ref Range   Neutrophils Relative % 83 %   Neutro Abs 4.2 1.4 - 6.5 K/uL   Lymphocytes Relative 11 %   Lymphs Abs 0.6 (L) 1.0 - 3.6 K/uL   Monocytes Relative 5 %   Monocytes Absolute 0.3 0.2 - 1.0 K/uL   Eosinophils Relative 0 %   Eosinophils Absolute 0.0 0 - 0.7 K/uL   Basophils Relative 1 %   Basophils Absolute 0.1 0 - 0.1 K/uL  Comprehensive metabolic panel     Status: Abnormal   Collection Time: 11/16/2014  4:39 PM  Result Value Ref Range   Sodium 133 (L) 135 - 145 mmol/L   Potassium 4.4 3.5 - 5.1 mmol/L   Chloride 103 101 - 111 mmol/L   CO2 22 22 - 32 mmol/L   Glucose, Bld 330 (H) 65 - 99 mg/dL   BUN 30 (H) 6 - 20 mg/dL   Creatinine, Ser 1.18 0.61 - 1.24 mg/dL   Calcium 8.3 (L) 8.9 - 10.3 mg/dL   Total Protein 6.2 (L) 6.5 - 8.1 g/dL   Albumin 3.1 (L) 3.5 - 5.0 g/dL   AST 28 15 - 41 U/L   ALT 24 17 - 63 U/L   Alkaline Phosphatase 110 38 - 126 U/L   Total Bilirubin 0.5 0.3 - 1.2 mg/dL   GFR calc non Af Amer >60 >60 mL/min   GFR calc Af Amer >60 >60 mL/min    Comment: (NOTE) The eGFR has been calculated using the CKD EPI equation. This calculation has not been validated in all clinical situations. eGFR's persistently <60 mL/min signify possible Chronic Kidney Disease.    Anion gap 8 5 - 15  Troponin I     Status: None   Collection Time: 11/26/2014  4:39 PM  Result Value Ref Range   Troponin I <0.03 <0.031 ng/mL    Comment:        NO INDICATION OF MYOCARDIAL INJURY.   Urinalysis complete, with microscopic ([facility] only)     Status: Abnormal   Collection Time: 11/18/2014  4:39 PM  Result Value Ref Range    Color, Urine YELLOW (A) YELLOW   APPearance HAZY (A) CLEAR   Glucose, UA >500 (A) NEGATIVE mg/dL   Bilirubin Urine NEGATIVE NEGATIVE   Ketones, ur TRACE (A) NEGATIVE mg/dL   Specific Gravity, Urine 1.012 1.005 - 1.030   Hgb urine dipstick 2+ (A) NEGATIVE   pH 5.0 5.0 - 8.0   Protein, ur >500 (A) NEGATIVE mg/dL   Nitrite NEGATIVE NEGATIVE   Leukocytes, UA NEGATIVE NEGATIVE   RBC / HPF 0-5 0 - 5 RBC/hpf   WBC, UA 6-30 0 - 5 WBC/hpf   Bacteria, UA FEW (A) NONE SEEN   Squamous Epithelial / LPF 0-5 (A) NONE SEEN   Budding Yeast PRESENT   Glucose, capillary     Status: Abnormal   Collection Time: 12/06/2014  4:43 PM  Result Value Ref Range   Glucose-Capillary 347 (H) 65 - 99 mg/dL  Blood gas, arterial     Status: Abnormal   Collection Time: 11/13/2014  5:25 PM  Result Value Ref Range   FIO2 100.00 %   Delivery systems VENTILATOR    Mode ASSIST CONTROL    VT 550 mL   Rate 16 resp/min   Peep/cpap 5.0 cm H20   pH, Arterial 7.22 (L) 7.350 - 7.450   pCO2 arterial 48 32.0 - 48.0 mmHg   pO2, Arterial 599 (H) 83.0 - 108.0 mmHg   Bicarbonate 19.6 (  L) 21.0 - 28.0 mEq/L   Acid-base deficit 8.2 (H) 0.0 - 2.0 mmol/L   Patient temperature 37.0    Collection site RIGHT BRACHIAL    Sample type ARTERIAL DRAW    Allens test (pass/fail) POSITIVE (A) PASS   Dg Chest 1 View  11/26/2014   CLINICAL DATA:  Unresponsive, intubated  EXAM: CHEST  1 VIEW  COMPARISON:  02/04/2013  FINDINGS: Lungs are clear.  No pleural effusion or pneumothorax.  Endotracheal tube terminates 6 cm above the carina.  The heart is normal in size.  IMPRESSION: Endotracheal tube terminates 6 cm above the carina.   Electronically Signed   By: Julian Hy M.D.   On: 11/26/2014 17:44   Ct Head Wo Contrast  11/26/2014   CLINICAL DATA:  64 year old male unresponsive  EXAM: CT HEAD WITHOUT CONTRAST  CT CERVICAL SPINE WITHOUT CONTRAST  TECHNIQUE: Multidetector CT imaging of the head and cervical spine was performed following the  standard protocol without intravenous contrast. Multiplanar CT image reconstructions of the cervical spine were also generated.  COMPARISON:  Head CT dated 11/28/2013  FINDINGS: CT HEAD FINDINGS  There is a large left basal ganglia intraparenchymal hemorrhage measuring approximately 8.5 x 5.1 cm in axial dimensions. There is extension of the clot into the lateral ventricles. There is compression of the third ventricle. The fourth ventricle is clear. There is mass effect and 1.5 cm left to right midline shift. There is mild effacement of the left quadrigeminal plate cisterns. Mild periventricular and deep white matter hypodensities represent chronic microvascular ischemic changes. There is mild mucoperiosteal thickening of the paranasal sinuses. The mastoid air cells are well aerated. The calvarium is intact.  CT CERVICAL SPINE FINDINGS  There is no acute fracture or subluxation of the cervical spine.Multilevel degenerative changes. There is reversal of normal cervical lordosis at C4-C6 which may be positional or due to muscle spasm.The odontoid and spinous processes are intact.There is normal anatomic alignment of the C1-C2 lateral masses. The visualized soft tissues appear unremarkable.  An endotracheal tube is partially visualized.  IMPRESSION: Large left basal ganglia intraparenchymal hemorrhage with extension into the lateral ventricles. There is mass-effect approximately 1.5 cm left-to-right midline shift.  No acute cervical spine fracture.  Critical Value/emergent results were called by telephone at the time of interpretation on 12/05/2014 at 6:01 pm to Dr. Lenise Arena , who verbally acknowledged these results.   Electronically Signed   By: Anner Crete M.D.   On: 11/16/2014 18:12   Ct Cervical Spine Wo Contrast  11/20/2014   CLINICAL DATA:  64 year old male unresponsive  EXAM: CT HEAD WITHOUT CONTRAST  CT CERVICAL SPINE WITHOUT CONTRAST  TECHNIQUE: Multidetector CT imaging of the head and cervical  spine was performed following the standard protocol without intravenous contrast. Multiplanar CT image reconstructions of the cervical spine were also generated.  COMPARISON:  Head CT dated 11/28/2013  FINDINGS: CT HEAD FINDINGS  There is a large left basal ganglia intraparenchymal hemorrhage measuring approximately 8.5 x 5.1 cm in axial dimensions. There is extension of the clot into the lateral ventricles. There is compression of the third ventricle. The fourth ventricle is clear. There is mass effect and 1.5 cm left to right midline shift. There is mild effacement of the left quadrigeminal plate cisterns. Mild periventricular and deep white matter hypodensities represent chronic microvascular ischemic changes. There is mild mucoperiosteal thickening of the paranasal sinuses. The mastoid air cells are well aerated. The calvarium is intact.  CT CERVICAL SPINE FINDINGS  There is no acute fracture or subluxation of the cervical spine.Multilevel degenerative changes. There is reversal of normal cervical lordosis at C4-C6 which may be positional or due to muscle spasm.The odontoid and spinous processes are intact.There is normal anatomic alignment of the C1-C2 lateral masses. The visualized soft tissues appear unremarkable.  An endotracheal tube is partially visualized.  IMPRESSION: Large left basal ganglia intraparenchymal hemorrhage with extension into the lateral ventricles. There is mass-effect approximately 1.5 cm left-to-right midline shift.  No acute cervical spine fracture.  Critical Value/emergent results were called by telephone at the time of interpretation on 11/17/2014 at 6:01 pm to Dr. Lenise Arena , who verbally acknowledged these results.   Electronically Signed   By: Anner Crete M.D.   On: 11/20/2014 18:12    Assessment: 64 y.o. male found unresponsive at home by his wife, found to have a large left basal ganglia intraparenchymal hemorrhage with extension into the lateral ventricles, with  mass-effect approximately 1.5 cm left-to-right midline shift but no frank hydrocephalus. Patient has been on xarelto for DVT but he had SBP>230 at [facility]. Although location of this [place] makes a primary hypertensive ICH more likely, can not exclude the possibility of xarelto-related ICH. He was already treated with K centra. Had an ample conversation with wife and brother in terms of prognostic for meaningful neurological recovery. Wife said that he always expressed his wishes of not living without having good quality of life and for that reason they are against heroic measures or even placing a central line for treatment of brain swelling. He is now DNR. Will continue to treat BP with IV nicardipine, target SBP,160. Osmotherapy with Mannitol, checking and maintaining serum osmolality <320 before each dose. Has a seizure history, thus Keppra for seizure prophylaxis. Stroke team will follow up tomorrow.  Stroke Risk Factors - HTN, DM  Dorian Pod ,MD [facility] [phone]  12/10/2014, 10:22 PM

## 2014-11-28 NOTE — ED Notes (Signed)
Family at bedside. Williams MD in to talk to family.

## 2014-11-28 NOTE — ED Notes (Signed)
Pt intubated. Williams MD and RT at bedside.

## 2014-11-28 NOTE — Progress Notes (Addendum)
eLink Physician-Brief Progress Note Patient Name: Genoveva IllJames E Benito DOB: [birth year] MRN: [id number]   Date of Service  02-28-2015  HPI/Events of Note  Patient is on mechanical ventilation and blood glucose = 400's. Patient already on Protonix and SCD's.  eICU Interventions  Will order: 1. Vent settings: 60%/PRVC 14/TV 640/P 5. 2. ABG now. 3. Insulin IV infusion for glucose stabilization.      Intervention Category Major Interventions: Respiratory failure - evaluation and management Intermediate Interventions: Hyperglycemia - evaluation and treatment  Sommer,Steven Eugene 02-28-2015, 10:46 PM

## 2014-11-28 NOTE — ED Notes (Signed)
Pt back from CT, intubated on cardene gtts. BP 133/71. Pending further MD order.

## 2014-11-28 NOTE — ED Notes (Signed)
Pt intubated using 8 in tube, 23cm at lip per RT. Capnography change +, bilateral breath sounds present.

## 2014-11-28 NOTE — ED Notes (Signed)
Pt with blood pressure of 115/73, dr. Mayford KnifeWilliams states to turn cardene off at this time.

## 2014-11-28 NOTE — ED Notes (Addendum)
Pt with blood pressure of 128/99. Dr. Mayford KnifeWilliams notified no new orders received, no order to resume cardene received. Ems here to transport pt.

## 2014-11-28 NOTE — ED Notes (Signed)
Report from hunter ore, rn. 

## 2014-11-28 NOTE — ED Notes (Signed)
Pt via EMS with unresponsive, only to pain. Last seen well by wife this am at apx 0800. Left lateral eye gaze. Williams MD at bedside at time of arrival. Code stroke called. RT at bedside. RIK kit pulled.

## 2014-11-29 ENCOUNTER — Inpatient Hospital Stay (HOSPITAL_COMMUNITY): Payer: 59

## 2014-11-29 ENCOUNTER — Encounter (HOSPITAL_COMMUNITY): Payer: Self-pay | Admitting: *Deleted

## 2014-11-29 DIAGNOSIS — G936 Cerebral edema: Secondary | ICD-10-CM | POA: Insufficient documentation

## 2014-11-29 LAB — GLUCOSE, CAPILLARY
GLUCOSE-CAPILLARY: 239 mg/dL — AB (ref 65–99)
GLUCOSE-CAPILLARY: 152 mg/dL — AB (ref 65–99)
GLUCOSE-CAPILLARY: 108 mg/dL — AB (ref 65–99)
Glucose-Capillary: 156 mg/dL — ABNORMAL HIGH (ref 65–99)
Glucose-Capillary: 170 mg/dL — ABNORMAL HIGH (ref 65–99)
Glucose-Capillary: 173 mg/dL — ABNORMAL HIGH (ref 65–99)
Glucose-Capillary: 346 mg/dL — ABNORMAL HIGH (ref 65–99)
Glucose-Capillary: 404 mg/dL — ABNORMAL HIGH (ref 65–99)

## 2014-11-29 LAB — OSMOLALITY
OSMOLALITY: 309 mosm/kg — AB (ref 275–300)
Osmolality: 310 mOsm/kg — ABNORMAL HIGH (ref 275–300)

## 2014-11-29 MED ORDER — CETYLPYRIDINIUM CHLORIDE 0.05 % MT LIQD
7.0000 mL | Freq: Four times a day (QID) | OROMUCOSAL | Status: DC
  Administered 2014-11-29 – 2014-12-01 (×11): 7 mL via OROMUCOSAL

## 2014-11-29 MED ORDER — CHLORHEXIDINE GLUCONATE 0.12 % MT SOLN
15.0000 mL | Freq: Two times a day (BID) | OROMUCOSAL | Status: DC
  Administered 2014-11-29 – 2014-11-30 (×4): 15 mL via OROMUCOSAL
  Filled 2014-11-29 (×4): qty 15

## 2014-11-29 NOTE — Progress Notes (Signed)
PT Cancellation Note  Patient Details Name: Andrew Dillon MRN: [id number] DOB: 07-07-1950   Cancelled Treatment:    Reason Eval/Treat Not Completed: Medical issues which prohibited therapy   Noted pt continues to be on Bedrest, on Vent;   Will follow-up tomorrow, and perform PT eval as appropriate;  Pt will need to be off Bedrest activity in order to proceed with PT evaluation;   Thank you,  [name], PT  Acute Rehabilitation Services Pager [phone] Office [phone]    [name], [name] [facility] 11/29/2014, 7:59 AM

## 2014-11-29 NOTE — Progress Notes (Signed)
STROKE TEAM PROGRESS NOTE   HISTORY Andrew Dillon is an 64 y.o. male with a past medical history significant for HTN, DM, DVT on xarelto, and predominantly nocturnal GTC seizures, transferred to [facility] for further management of left ICH. Patient is currently intubated on the vent, and her daughter and bother at the bedside are providing the pertinent clinical information. According to wife, she left the house around 8 am today and he was normal. Then, she went to work and she came back home he was unresponsive on the floor. At that time he was able to roll himself over towards his right side, he did seem to move his left arm. EMS was summoned and upon arrival to [facility] he was noted to have a SBP>230. CT brain was urgently obtained and revealed a pretty large left basal ganglia intraparenchymal hemorrhage with extension into the lateral ventricles, with mass-effect approximately 1.5 cm left-to-right midline shift but no frank hydrocephalus. Patient was also treated with K centra as he has been on xarelto for DVT, and started on IV nicardipine infusion. Serologies significant for PTT 28, INR 1.09, platelets 273  Date last known well: 11/30/2014 Time last known well: 8 am tPA Given: no, ICH   SUBJECTIVE (INTERVAL HISTORY)  Patient's blood pressure has been adequately controlled overnight. He remains unresponsive. Patient's wife is at the bedside along with her sister  OBJECTIVE Temp:  [97.4 F (36.3 C)-98.9 F (37.2 C)] 97.5 F (36.4 C) (07/17 0357) Pulse Rate:  [67-105] 86 (07/17 0645) Cardiac Rhythm:  [-]  Resp:  [13-25] 14 (07/17 0645) BP: (121-236)/(67-118) 147/74 mmHg (07/17 0645) SpO2:  [95 %-100 %] 100 % (07/17 0645) FiO2 (%):  [40 %-100 %] 40 % (07/17 0301) Weight:  [69.6 kg (153 lb 7 oz)-88.451 kg (195 lb)] 69.6 kg (153 lb 7 oz) (07/16 2130)   Recent Labs Lab 11/19/2014 1643 11/18/2014 2137 12/13/2014 2352 11/29/14 0347  GLUCAP 347* 404* 346* 239*    Recent Labs Lab  11/16/2014 1639  NA 133*  K 4.4  CL 103  CO2 22  GLUCOSE 330*  BUN 30*  CREATININE 1.18  CALCIUM 8.3*    Recent Labs Lab 11/15/2014 1639  AST 28  ALT 24  ALKPHOS 110  BILITOT 0.5  PROT 6.2*  ALBUMIN 3.1*    Recent Labs Lab 11/23/2014 1639  WBC 5.1  NEUTROABS 4.2  HGB 12.8*  HCT 38.5*  MCV 90.7  PLT 180    Recent Labs Lab 12/13/2014 1639  TROPONINI <0.03    Recent Labs  11/23/2014 1639  LABPROT 14.3  INR 1.09    Recent Labs  12/03/2014 1639  COLORURINE YELLOW*  LABSPEC 1.012  PHURINE 5.0  GLUCOSEU >500*  HGBUR 2+*  BILIRUBINUR NEGATIVE  KETONESUR TRACE*  PROTEINUR >500*  NITRITE NEGATIVE  LEUKOCYTESUR NEGATIVE    No results found for: CHOL, TRIG, HDL, CHOLHDL, VLDL, LDLCALC No results found for: HGBA1C No results found for: LABOPIA, COCAINSCRNUR, LABBENZ, AMPHETMU, THCU, LABBARB  No results for input(s): ETH in the last 168 hours.   Imaging   Dg Chest 1 View 11/24/2014    Endotracheal tube terminates 6 cm above the carina.     Ct Head Wo Contrast 11/18/2014    Large left basal ganglia intraparenchymal hemorrhage with extension into the lateral ventricles. There is mass-effect approximately 1.5 cm left-to-right midline shift.       Ct Head W Contrast 11/29/2014    1. Stable to slight enlargement of large intra-axial left hemisphere hemorrhage  since yesterday. Estimated intra-axial blood volume is 137 mL.  2. Stable intraventricular extension. No ventriculomegaly. Trace subarachnoid hemorrhage in the right sylvian fissure.  3. Rightward midline shift is stable at 14 mm. Basilar cisterns remain patent.  4. No new intracranial abnormality identified.       Ct Cervical Spine Wo Contrast 11/26/2014    No acute cervical spine fracture.        PHYSICAL EXAM Middle-aged African-American male who is intubated but not sedated. . Afebrile. Head is nontraumatic. Neck is supple without bruit.    Cardiac exam no murmur or gallop. Lungs are clear to  auscultation. Distal pulses are well felt.  Neurological Exam :  Comatose. Eyes closed. Does not follow commands. Left gaze preference but some spontaneous side to side movements up to midline. Pupils 2 mm reactive. Corneal reflexes are present. Fundi could not be visualized. Right lower facial weakness. Dense right hemiplegia with hypotonia. Withdraws left upper and lower extremities purposefully and does have some spontaneous movements on the left as well. Right plantar upgoing left downgoing.   ASSESSMENT/PLAN Mr. Andrew Dillon is a 64 y.o. male with history of diabetes mellitus, seizure disorder, hypertension, and a history of DVT on Xarelto presenting with unresponsiveness and elevated blood pressure. He did not receive IV t-PA due to ICH.  Stroke:  Dominant - Large left basal ganglia intraparenchymal hemorrhage.  Resultant unresponsiveness - now intubated on a ventilator  MRI not performed  MRA  not performed  CT - Large left basal ganglia intraparenchymal hemorrhage with extension into the lateral ventricles.   Carotid Doppler not indicated  2D Echo not indicated  LDL not indicated  HgbA1c not ordered  SCDs for VTE prophylaxis  Diet NPO time specified  xarelto ( rivaroxaban) prior to admission, now on no antithrombotic secondary to ICH  Ongoing aggressive stroke risk factor management  Therapy recommendations: Pending  Disposition: Pending  Hypertension  Home meds: Norvasc, HCTZ, Cozaar, and Toprol  Stable   Diabetes  HgbA1c not ordered, goal < 7.0  Uncontrolled  Other Stroke Risk Factors  Advanced age   Other Active Problems  Intubated on ventilator  Other Pertinent History  Seizure disorder - Keppra prior to admission  Hospital day # 1 I have personally examined this patient, reviewed notes, independently viewed imaging studies, participated in medical decision making and plan of care. I have made any additions or clarifications directly  to the above note. Agree with note above. He has unfortunately presented with a massive left brain basal ganglia hemorrhage with extension into the lateral ventricles with cerebral edema with 14 mm left to right transfalcine brain herniation and intraventricular hemorrhage. His prognosis is quite poor and is unlikely to survive just devastating brain hemorrhage which has a volume of 137 cubic cc. I had a long discussion with the patient's wife and sister-in-law at the bedside and they both clearly stated patient would not have wanted to prolong life support and live a life of disability and they are leaning towards comfort care but would like to wait till the family is notified and can say that could arise. Hence we will make the patient DO NOT RESUSCITATE but we will not escalate the level of care further This patient is critically ill and at significant risk of neurological worsening, death and care requires constant monitoring of vital signs, hemodynamics,respiratory and cardiac monitoring, extensive review of multiple databases, frequent neurological assessment, discussion with family, other specialists and medical decision making of high complexity.I have  made any additions or clarifications directly to the above note.This critical care time does not reflect procedure time, or teaching time or supervisory time of PA/NP/Med Resident etc but could involve care discussion time.  I spent 34 minutes of neurocritical care time  in the care of  this patient.     Delia HeadyPramod Sethi, MD Medical Director [facility] Pager: [phone] 11/29/2014 1:33 PM      To contact Stroke Continuity provider, please refer to [email or web address]. After hours, contact General Neurology

## 2014-11-29 NOTE — Consult Note (Signed)
PULMONARY / CRITICAL CARE MEDICINE   Name: Andrew Dillon DOBOSZ MRN: [id number] DOB: 06-Aug-1950    ADMISSION DATE:  11/24/2014 CONSULTATION DATE:  11/29/2014  REFERRING MD :  Cyril Mourning, Neurology  CHIEF COMPLAINT:  Ventilator management  INITIAL PRESENTATION:  64 yo male on xarelto for DVT presented with altered mental status from HTN emergency and ICH.  Intubated for airway protection.  STUDIES:  Head CT 7/16 >> large left basal ganglia bleed with extension into the lateral ventricles, compression of the third ventricle, 1.5 cm midline shift  SIGNIFICANT EVENTS: 7/16 Transfer from [facility]  SUBJECTIVE:  Breaths over set vent rate  VITAL SIGNS: Temp:  [97.4 F (36.3 C)-98.9 F (37.2 C)] 97.5 F (36.4 C) (07/17 0357) Pulse Rate:  [67-105] 86 (07/17 0645) Resp:  [13-25] 14 (07/17 0645) BP: (121-236)/(67-118) 147/74 mmHg (07/17 0645) SpO2:  [95 %-100 %] 100 % (07/17 0645) FiO2 (%):  [40 %-100 %] 40 % (07/17 0301) Weight:  [153 lb 7 oz (69.6 kg)-195 lb (88.451 kg)] 153 lb 7 oz (69.6 kg) (07/16 2130) VENTILATOR SETTINGS: Vent Mode:  [-] PRVC FiO2 (%):  [40 %-100 %] 40 % Set Rate:  [14 bmp-16 bmp] 14 bmp Vt Set:  [550 mL-640 mL] 640 mL PEEP:  [5 cmH20] 5 cmH20 Plateau Pressure:  [16 cmH20-21 cmH20] 21 cmH20 INTAKE / OUTPUT:  Intake/Output Summary (Last 24 hours) at 11/29/14 0732 Last data filed at 11/29/14 0600  Gross per 24 hour  Intake 1084.67 ml  Output   1020 ml  Net  64.67 ml    PHYSICAL EXAMINATION: General: not on sedation HEENT: Pupils pinpoint, Lt ward eye gaze, ETT in place Lungs: no wheeze Cardiovascular: no edema Abdomen: soft, non tender Musculoskeletal: No edema Neuro: comatose Skin: no rashes  LABS:  CBC  Recent Labs Lab 11/14/2014 1639  WBC 5.1  HGB 12.8*  HCT 38.5*  PLT 180   Coag's  Recent Labs Lab 12/13/2014 1639  APTT 28  INR 1.09   BMET  Recent Labs Lab 12/03/2014 1639  NA 133*  K 4.4  CL 103  CO2 22  BUN 30*  CREATININE 1.18   GLUCOSE 330*   Electrolytes  Recent Labs Lab 12/09/2014 1639  CALCIUM 8.3*   ABG  Recent Labs Lab 11/16/2014 1725 12/08/2014 2320  PHART 7.22* 7.354  PCO2ART 48 38.4  PO2ART 599* 176*   Liver Enzymes  Recent Labs Lab 11/27/2014 1639  AST 28  ALT 24  ALKPHOS 110  BILITOT 0.5  ALBUMIN 3.1*   Cardiac Enzymes  Recent Labs Lab 11/24/2014 1639  TROPONINI <0.03   Glucose  Recent Labs Lab 12/07/2014 1643 11/19/2014 2137 12/05/2014 2352 11/29/14 0347  GLUCAP 347* 404* 346* 239*    Imaging Dg Chest 1 View  12/01/2014   CLINICAL DATA:  Unresponsive, intubated  EXAM: CHEST  1 VIEW  COMPARISON:  02/04/2013  FINDINGS: Lungs are clear.  No pleural effusion or pneumothorax.  Endotracheal tube terminates 6 cm above the carina.  The heart is normal in size.  IMPRESSION: Endotracheal tube terminates 6 cm above the carina.   Electronically Signed   By: Charline Bills M.D.   On: 11/23/2014 17:44   Ct Head Wo Contrast  12/06/2014   CLINICAL DATA:  64 year old male unresponsive  EXAM: CT HEAD WITHOUT CONTRAST  CT CERVICAL SPINE WITHOUT CONTRAST  TECHNIQUE: Multidetector CT imaging of the head and cervical spine was performed following the standard protocol without intravenous contrast. Multiplanar CT image reconstructions of the cervical spine  were also generated.  COMPARISON:  Head CT dated 11/28/2013  FINDINGS: CT HEAD FINDINGS  There is a large left basal ganglia intraparenchymal hemorrhage measuring approximately 8.5 x 5.1 cm in axial dimensions. There is extension of the clot into the lateral ventricles. There is compression of the third ventricle. The fourth ventricle is clear. There is mass effect and 1.5 cm left to right midline shift. There is mild effacement of the left quadrigeminal plate cisterns. Mild periventricular and deep white matter hypodensities represent chronic microvascular ischemic changes. There is mild mucoperiosteal thickening of the paranasal sinuses. The mastoid air  cells are well aerated. The calvarium is intact.  CT CERVICAL SPINE FINDINGS  There is no acute fracture or subluxation of the cervical spine.Multilevel degenerative changes. There is reversal of normal cervical lordosis at C4-C6 which may be positional or due to muscle spasm.The odontoid and spinous processes are intact.There is normal anatomic alignment of the C1-C2 lateral masses. The visualized soft tissues appear unremarkable.  An endotracheal tube is partially visualized.  IMPRESSION: Large left basal ganglia intraparenchymal hemorrhage with extension into the lateral ventricles. There is mass-effect approximately 1.5 cm left-to-right midline shift.  No acute cervical spine fracture.  Critical Value/emergent results were called by telephone at the time of interpretation on 12/01/2014 at 6:01 pm to Dr. Daryel November , who verbally acknowledged these results.   Electronically Signed   By: Elgie Collard M.D.   On: 11/27/2014 18:12   Ct Cervical Spine Wo Contrast  12/01/2014   CLINICAL DATA:  64 year old male unresponsive  EXAM: CT HEAD WITHOUT CONTRAST  CT CERVICAL SPINE WITHOUT CONTRAST  TECHNIQUE: Multidetector CT imaging of the head and cervical spine was performed following the standard protocol without intravenous contrast. Multiplanar CT image reconstructions of the cervical spine were also generated.  COMPARISON:  Head CT dated 11/28/2013  FINDINGS: CT HEAD FINDINGS  There is a large left basal ganglia intraparenchymal hemorrhage measuring approximately 8.5 x 5.1 cm in axial dimensions. There is extension of the clot into the lateral ventricles. There is compression of the third ventricle. The fourth ventricle is clear. There is mass effect and 1.5 cm left to right midline shift. There is mild effacement of the left quadrigeminal plate cisterns. Mild periventricular and deep white matter hypodensities represent chronic microvascular ischemic changes. There is mild mucoperiosteal thickening of the  paranasal sinuses. The mastoid air cells are well aerated. The calvarium is intact.  CT CERVICAL SPINE FINDINGS  There is no acute fracture or subluxation of the cervical spine.Multilevel degenerative changes. There is reversal of normal cervical lordosis at C4-C6 which may be positional or due to muscle spasm.The odontoid and spinous processes are intact.There is normal anatomic alignment of the C1-C2 lateral masses. The visualized soft tissues appear unremarkable.  An endotracheal tube is partially visualized.  IMPRESSION: Large left basal ganglia intraparenchymal hemorrhage with extension into the lateral ventricles. There is mass-effect approximately 1.5 cm left-to-right midline shift.  No acute cervical spine fracture.  Critical Value/emergent results were called by telephone at the time of interpretation on 12/08/2014 at 6:01 pm to Dr. Daryel November , who verbally acknowledged these results.   Electronically Signed   By: Elgie Collard M.D.   On: 11/14/2014 18:12     ASSESSMENT / PLAN:  PULMONARY ETT 7/16 >> A:  Compromised airway 2nd to ICH. P:   Pressure support wean as tolerated >> mental status precludes extubation  F/u CXR intermittently  CARDIOVASCULAR A:  HTN emergency. Hx of DVT.  P:  Cardene drip with goal MAP 110-120 or SBP 160-180 per neurology  RENAL A:   No acute issues. P:   Monitor urine outpt  GASTROINTESTINAL A:  Nutrition. P:   Tube feeds if family wishes to continue aggressive care Protonix  HEMATOLOGIC A:   History of DVT and Xarelto >> treated with K centra. P:  F/u coags, CBC  INFECTIOUS A:   No evidence for infection. P:   Monitor fever curve  ENDOCRINE A:   DM type II. P:   SSI  NEUROLOGIC A:   Massive left ICH with shift >> family opted against CVL placement for 3% NS. Hx of seizures. P:   RASS goal: 0 AED's per neurology  Summary: He has massive ICH.  Family would not want heroic measures >> CVL, 3% NS, insulin gtt.  He  is DNR.  Will defer to neurology to further discuss goals of care with pt's family >> comfort care might be best option.  Coralyn HellingVineet Bassem Bernasconi, MD [facility] 11/29/2014, 7:39 AM Pager:  [phone] After 3pm call: [phone]

## 2014-11-29 NOTE — Progress Notes (Signed)
Pt transported to CT and back without complications. 

## 2014-11-29 NOTE — Progress Notes (Signed)
PULMONARY / CRITICAL CARE MEDICINE   Name: Andrew Dillon MRN: [id number] DOB: Aug 14, 1950    ADMISSION DATE:  11/22/2014 CONSULTATION DATE:  11/29/2014  REFERRING MD :  Cyril Mourning, Neurology  CHIEF COMPLAINT:  Ventilator management  INITIAL PRESENTATION: 64 year old who with massive left intracerebral hemorrhage requiring mechanical ventilation  STUDIES:  Head CT 7/16 >> large left basal ganglia bleed with extension into the lateral ventricles, compression of the third ventricle, 1.5 cm midline shift  SIGNIFICANT EVENTS: 7/16 ETT >>   HISTORY OF PRESENT ILLNESS:  64 year old transferred from [facility] ED, he is unresponsive so history obtained after reviewing the chart. He was last seen by his wife at 8 AM this morning, she found him unresponsive on the floor when she returned from work. Head CT showed a large left pain bleed at [facility] ED. He was intubated for airway protection and transferred to [facility] for further care. He was on [employer], transported on propofol drip. He was also placed on Cardene drip for hypertension. MAP on arrival to [facility] ED was 150   SUBJECTIVE: More responsive Afebrile On cardene gtt  VITAL SIGNS: Temp:  [97.4 F (36.3 C)-98.9 F (37.2 C)] 97.5 F (36.4 C) (07/17 0357) Pulse Rate:  [67-105] 86 (07/17 0645) Resp:  [13-25] 14 (07/17 0645) BP: (121-236)/(67-118) 147/74 mmHg (07/17 0645) SpO2:  [95 %-100 %] 100 % (07/17 0645) FiO2 (%):  [40 %-100 %] 40 % (07/17 0301) Weight:  [153 lb 7 oz (69.6 kg)-195 lb (88.451 kg)] 153 lb 7 oz (69.6 kg) (07/16 2130) HEMODYNAMICS:   VENTILATOR SETTINGS: Vent Mode:  [-] PRVC FiO2 (%):  [40 %-100 %] 40 % Set Rate:  [14 bmp-16 bmp] 14 bmp Vt Set:  [550 mL-640 mL] 640 mL PEEP:  [5 cmH20] 5 cmH20 Plateau Pressure:  [16 cmH20-21 cmH20] 21 cmH20 INTAKE / OUTPUT:  Intake/Output Summary (Last 24 hours) at 11/29/14 0719 Last data filed at 11/29/14 0600  Gross per 24 hour  Intake 1084.67 ml  Output    1020 ml  Net  64.67 ml    PHYSICAL EXAMINATION: General:  Acutely ill, orally intubated ENT - no lesions, no post nasal drip, left gaze preference, nystagmus + Neck: No JVD, no thyromegaly, no carotid bruits, cervical collar Lungs: no use of accessory muscles, no dullness to percussion, clear without rales or rhonchi  Cardiovascular: Rhythm regular, heart sounds  normal, no murmurs, no peripheral edema Abdomen: soft and non-tender, no hepatosplenomegaly, BS normal. Musculoskeletal: No deformities, no cyanosis or clubbing Neuro:  Opens eyes to name, moves left upper extremity, could not elicit plantars Skin:  Warm, no lesions/ rash   LABS:  CBC  Recent Labs Lab 12/04/2014 1639  WBC 5.1  HGB 12.8*  HCT 38.5*  PLT 180   Coag's  Recent Labs Lab 12/06/2014 1639  APTT 28  INR 1.09   BMET  Recent Labs Lab 11/26/2014 1639  NA 133*  K 4.4  CL 103  CO2 22  BUN 30*  CREATININE 1.18  GLUCOSE 330*   Electrolytes  Recent Labs Lab 11/27/2014 1639  CALCIUM 8.3*   Sepsis Markers No results for input(s): LATICACIDVEN, PROCALCITON, O2SATVEN in the last 168 hours. ABG  Recent Labs Lab 11/17/2014 1725 12/01/2014 2320  PHART 7.22* 7.354  PCO2ART 48 38.4  PO2ART 599* 176*   Liver Enzymes  Recent Labs Lab 12/10/2014 1639  AST 28  ALT 24  ALKPHOS 110  BILITOT 0.5  ALBUMIN 3.1*   Cardiac Enzymes  Recent Labs Lab  11-Feb-2015 1639  TROPONINI <0.03   Glucose  Recent Labs Lab 11-Feb-2015 1643 11-Feb-2015 2137 11-Feb-2015 2352 11/29/14 0347  GLUCAP 347* 404* 346* 239*    Imaging Dg Chest 1 View  07-23-14   CLINICAL DATA:  Unresponsive, intubated  EXAM: CHEST  1 VIEW  COMPARISON:  02/04/2013  FINDINGS: Lungs are clear.  No pleural effusion or pneumothorax.  Endotracheal tube terminates 6 cm above the carina.  The heart is normal in size.  IMPRESSION: Endotracheal tube terminates 6 cm above the carina.   Electronically Signed   By: Charline BillsSriyesh  Krishnan M.D.   On: 003-10-16  17:44   Ct Head Wo Contrast  07-23-14   CLINICAL DATA:  64 year old male unresponsive  EXAM: CT HEAD WITHOUT CONTRAST  CT CERVICAL SPINE WITHOUT CONTRAST  TECHNIQUE: Multidetector CT imaging of the head and cervical spine was performed following the standard protocol without intravenous contrast. Multiplanar CT image reconstructions of the cervical spine were also generated.  COMPARISON:  Head CT dated 11/28/2013  FINDINGS: CT HEAD FINDINGS  There is a large left basal ganglia intraparenchymal hemorrhage measuring approximately 8.5 x 5.1 cm in axial dimensions. There is extension of the clot into the lateral ventricles. There is compression of the third ventricle. The fourth ventricle is clear. There is mass effect and 1.5 cm left to right midline shift. There is mild effacement of the left quadrigeminal plate cisterns. Mild periventricular and deep white matter hypodensities represent chronic microvascular ischemic changes. There is mild mucoperiosteal thickening of the paranasal sinuses. The mastoid air cells are well aerated. The calvarium is intact.  CT CERVICAL SPINE FINDINGS  There is no acute fracture or subluxation of the cervical spine.Multilevel degenerative changes. There is reversal of normal cervical lordosis at C4-C6 which may be positional or due to muscle spasm.The odontoid and spinous processes are intact.There is normal anatomic alignment of the C1-C2 lateral masses. The visualized soft tissues appear unremarkable.  An endotracheal tube is partially visualized.  IMPRESSION: Large left basal ganglia intraparenchymal hemorrhage with extension into the lateral ventricles. There is mass-effect approximately 1.5 cm left-to-right midline shift.  No acute cervical spine fracture.  Critical Value/emergent results were called by telephone at the time of interpretation on 07-23-14 at 6:01 pm to Dr. Daryel NovemberJONATHAN WILLIAMS , who verbally acknowledged these results.   Electronically Signed   By: Elgie CollardArash   Radparvar M.D.   On: 003-10-16 18:12   Ct Cervical Spine Wo Contrast  07-23-14   CLINICAL DATA:  64 year old male unresponsive  EXAM: CT HEAD WITHOUT CONTRAST  CT CERVICAL SPINE WITHOUT CONTRAST  TECHNIQUE: Multidetector CT imaging of the head and cervical spine was performed following the standard protocol without intravenous contrast. Multiplanar CT image reconstructions of the cervical spine were also generated.  COMPARISON:  Head CT dated 11/28/2013  FINDINGS: CT HEAD FINDINGS  There is a large left basal ganglia intraparenchymal hemorrhage measuring approximately 8.5 x 5.1 cm in axial dimensions. There is extension of the clot into the lateral ventricles. There is compression of the third ventricle. The fourth ventricle is clear. There is mass effect and 1.5 cm left to right midline shift. There is mild effacement of the left quadrigeminal plate cisterns. Mild periventricular and deep white matter hypodensities represent chronic microvascular ischemic changes. There is mild mucoperiosteal thickening of the paranasal sinuses. The mastoid air cells are well aerated. The calvarium is intact.  CT CERVICAL SPINE FINDINGS  There is no acute fracture or subluxation of the cervical spine.Multilevel degenerative changes. There is  reversal of normal cervical lordosis at C4-C6 which may be positional or due to muscle spasm.The odontoid and spinous processes are intact.There is normal anatomic alignment of the C1-C2 lateral masses. The visualized soft tissues appear unremarkable.  An endotracheal tube is partially visualized.  IMPRESSION: Large left basal ganglia intraparenchymal hemorrhage with extension into the lateral ventricles. There is mass-effect approximately 1.5 cm left-to-right midline shift.  No acute cervical spine fracture.  Critical Value/emergent results were called by telephone at the time of interpretation on 12-09-2014 at 6:01 pm to Dr. Daryel November , who verbally acknowledged these results.    Electronically Signed   By: Elgie Collard M.D.   On: Dec 09, 2014 18:12     ASSESSMENT / PLAN:  PULMONARY OETT 7/16 >> A: Acute respiratory failure due to poor GCS P:   Vent settings reviewed and adjusted   CARDIOVASCULAR CVLnone A: Hypertensive emergency P:  Taper Cardene drip  Use labetalol or hydralazine prn  goal MAP 110-120 or SBP 160-180  RENAL A:  At risk AKI P:   Isotonic fluids  GASTROINTESTINAL A:  No issues P:   Nothing by mouth Protonix  HEMATOLOGIC A:  History of DVT and Xarelto P: Treated with K Centra   INFECTIOUS A:  No issues P:     ENDOCRINE A:  Hyperglycemia,diabetes  2 P:   SSI Will not use insulin drip  NEUROLOGIC A:  Massive left ICH with shift P:   RASS goal: 0 Intermittent Fent  Seizure prophylaxis per neurology-Keppra Mannitol with monitoring of osmolality  FAMILY  - Updates: Neurology discussed with wife, poor prognosis given  - Inter-disciplinary family meet or Palliative Care meeting due by:  7/23    TODAY'S SUMMARY: 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 Poor prognosis   The patient is critically ill with multiple organ systems failure and requires high complexity decision making for assessment and support, frequent evaluation and titration of therapies, application of advanced monitoring technologies and extensive interpretation of multiple databases. Critical Care Time devoted to patient care services described in this note independent of APP time is 31 minutes.   Cyril Mourning MD. Tonny Bollman. [facility] Pager [phone] If no response call [phone]    11/29/2014, 7:19 AM

## 2014-11-29 NOTE — Progress Notes (Signed)
SLP Cancellation Note  Patient Details Name: Andrew Dillon MRN: [id number] DOB: March 08, 1951   Cancelled treatment:       Reason Eval/Treat Not Completed: Patient not medically ready; pt intubated, ST to monitor for extubation and PO readiness  Marcene Duoshelsea Sumney MA, CCC-SLP Acute Care Speech Language Pathologist     Andrew Dillon, [name] 11/29/2014, 1:03 PM

## 2014-11-29 NOTE — Progress Notes (Signed)
Utilization Review Completed.Kristalynn Coddington T7/17/2016  

## 2014-11-30 ENCOUNTER — Inpatient Hospital Stay (HOSPITAL_COMMUNITY): Payer: 59

## 2014-11-30 DIAGNOSIS — I10 Essential (primary) hypertension: Secondary | ICD-10-CM

## 2014-11-30 DIAGNOSIS — J9600 Acute respiratory failure, unspecified whether with hypoxia or hypercapnia: Secondary | ICD-10-CM

## 2014-11-30 DIAGNOSIS — Z515 Encounter for palliative care: Secondary | ICD-10-CM | POA: Insufficient documentation

## 2014-11-30 DIAGNOSIS — I611 Nontraumatic intracerebral hemorrhage in hemisphere, cortical: Secondary | ICD-10-CM

## 2014-11-30 LAB — CBC
HCT: 35.5 % — ABNORMAL LOW (ref 39.0–52.0)
Hemoglobin: 11.9 g/dL — ABNORMAL LOW (ref 13.0–17.0)
MCH: 29.8 pg (ref 26.0–34.0)
MCHC: 33.5 g/dL (ref 30.0–36.0)
MCV: 89 fL (ref 78.0–100.0)
Platelets: 200 10*3/uL (ref 150–400)
RBC: 3.99 MIL/uL — ABNORMAL LOW (ref 4.22–5.81)
RDW: 16.2 % — ABNORMAL HIGH (ref 11.5–15.5)
WBC: 13.6 10*3/uL — AB (ref 4.0–10.5)

## 2014-11-30 LAB — BASIC METABOLIC PANEL
Anion gap: 13 (ref 5–15)
BUN: 37 mg/dL — ABNORMAL HIGH (ref 6–20)
CALCIUM: 8.7 mg/dL — AB (ref 8.9–10.3)
CHLORIDE: 108 mmol/L (ref 101–111)
CO2: 18 mmol/L — ABNORMAL LOW (ref 22–32)
Creatinine, Ser: 2.69 mg/dL — ABNORMAL HIGH (ref 0.61–1.24)
GFR calc non Af Amer: 24 mL/min — ABNORMAL LOW (ref >60)
GFR, EST AFRICAN AMERICAN: 27 mL/min — AB (ref >60)
Glucose, Bld: 227 mg/dL — ABNORMAL HIGH (ref 65–99)
Potassium: 4.3 mmol/L (ref 3.5–5.1)
Sodium: 139 mmol/L (ref 135–145)

## 2014-11-30 LAB — GLUCOSE, CAPILLARY
GLUCOSE-CAPILLARY: 214 mg/dL — AB (ref 65–99)
GLUCOSE-CAPILLARY: 222 mg/dL — AB (ref 65–99)
GLUCOSE-CAPILLARY: 200 mg/dL — AB (ref 65–99)

## 2014-11-30 MED ORDER — DEXTROSE 5 % IV SOLN
0.5000 mg/h | INTRAVENOUS | Status: DC
  Administered 2014-11-30: 1 mg/h via INTRAVENOUS
  Administered 2014-11-30: 2.5 mg/h via INTRAVENOUS
  Filled 2014-11-30: qty 10

## 2014-11-30 MED ORDER — ATROPINE SULFATE 1 % OP SOLN
2.0000 [drp] | Freq: Four times a day (QID) | OPHTHALMIC | Status: DC
  Administered 2014-11-30 – 2014-12-01 (×6): 2 [drp] via SUBLINGUAL
  Filled 2014-11-30: qty 2
  Filled 2014-11-30: qty 5

## 2014-11-30 MED ORDER — MORPHINE BOLUS VIA INFUSION
5.0000 mg | INTRAVENOUS | Status: DC | PRN
  Administered 2014-11-30: 2 mg via INTRAVENOUS
  Filled 2014-11-30 (×2): qty 20

## 2014-11-30 NOTE — Progress Notes (Signed)
OT Cancellation Note  Patient Details Name: Genoveva IllJames E Luther MRN: [id number] DOB: Sep 23, 1950   Cancelled Treatment:    Reason Eval/Treat Not Completed: Patient not medically ready. Pt still on bedrest, vented, and sedated. Will continue to monitor for appropriateness.  Evette GeorgesLeonard, Yanique Mulvihill Eva [phone] 11/30/2014, 7:36 AM

## 2014-11-30 NOTE — Clinical Documentation Improvement (Signed)
Possible Clinical Conditions?  Brain Herniation Coma Cerebral Edema Other Condition Cannot Clinically Determine   Supporting Information: (As per notes) Pt is unresponsive Head CT 7/16 >> large left basal ganglia bleed with extension into the lateral ventricles, compression of the third ventricle, 1.5 cm midline shift   Diagnostics: CT Head w/ contrast 11-29-14 IMPRESSION: 1. Stable to slight enlargement of large intra-axial left hemisphere hemorrhage since yesterday. Estimated intra-axial blood volume is 137 mL. 2. Stable intraventricular extension. No ventriculomegaly. Trace subarachnoid hemorrhage in the right sylvian fissure. 3. Rightward midline shift is stable at 14 mm. Basilar cisterns remain patent. 4. No new intracranial abnormality identified.    Thank You, Nevin BloodgoodJoan B Viana Sleep, RN, BSN, CCDS,Clinical Documentation Specialist:  [phone]  [phone]=[employer]- Health Information Management

## 2014-11-30 NOTE — Care Management Note (Signed)
Case Management Note  Patient Details  Name: Andrew Dillon MRN: [id number] Date of Birth: 1951-02-15  Subjective/Objective:    Pt admitted on Feb 02, 2015 with ICH with mass effect.  PTA, pt resided at home with spouse.               Action/Plan: Pt not responding; poor prognosis.  Family has chosen to offer comfort care.    Expected Discharge Date:                  Expected Discharge Plan:  Expired  In-House Referral:     Discharge planning Services  CM Consult  Post Acute Care Choice:    Choice offered to:     DME Arranged:    DME Agency:     HH Arranged:    HH Agency:     Status of Service:  In process, will continue to follow  Medicare Important Message Given:    Date Medicare IM Given:    Medicare IM give by:    Date Additional Medicare IM Given:    Additional Medicare Important Message give by:     If discussed at Long Length of Stay Meetings, dates discussed:    Additional Comments:  Quintella BatonJulie W. Sharron Simpson, RN, BSN  Trauma/Neuro ICU Case Manager [phone]

## 2014-11-30 NOTE — Progress Notes (Signed)
Noted urine output continue to range 20-6325ml/hr. MD made aware and discussed plans for possible withdrawal in AM. No further orders.

## 2014-11-30 NOTE — Progress Notes (Signed)
Saturations decreased to 60%, congestion now audible in throat. Deep subglottic suction performed but minimal clear secretions suctioned. Palliative MD aware and here speaking with pt's wife. Order received, bolus Morphine 2 mg given, rate increased and [facility]. Will continue to monitor and support.

## 2014-11-30 NOTE — Procedures (Signed)
Extubation Procedure Note  Patient Details:   Name: Andrew Dillon DOB: [birth year] MRN: [id number]   Airway Documentation:  Airway 8 mm (Active)  Secured at (cm) 24 cm 11/30/2014  7:26 AM  Measured From Lips 11/30/2014  7:26 AM  Secured Location Left 11/30/2014  7:26 AM  Secured By [employer] Tube Holder 11/30/2014  7:26 AM  Tube Holder Repositioned Yes 11/30/2014  7:26 AM  Cuff Pressure (cm H2O) 22 cm H2O 11/29/2014  8:31 AM  Site Condition Dry 11/30/2014  7:26 AM    Evaluation  O2 sats: stable throughout Complications: No apparent complications Patient did tolerate procedure well. Bilateral Breath Sounds: Clear, Diminished Suctioning: Airway No   Pt extubated to room air per Dr. Delton CoombesByrum for end of life withdrawal orders. Pt's sat was 96% once he was on room air. Family is at his bedside.   [name] 11/30/2014, 11:32 AM

## 2014-11-30 NOTE — Progress Notes (Signed)
Comfort care process explained to wife. Wife ready for extubation/comfort care. Pt extubated by RT. Currently on morphine drip tolerating well. Family at bedside. Will continue to provide support for pt and family during this time.

## 2014-11-30 NOTE — Progress Notes (Signed)
PULMONARY / CRITICAL CARE MEDICINE   Name: Andrew Dillon MRN: [id number] DOB: May 25, 1950    ADMISSION DATE:  11/24/2014 CONSULTATION DATE:  11/30/2014  REFERRING MD :  Cyril Mourning, Neurology  CHIEF COMPLAINT:  Ventilator management  INITIAL PRESENTATION:  64 yo male on xarelto for DVT presented with altered mental status from HTN emergency and ICH.  Intubated for airway protection.  STUDIES:  Head CT 7/16 >> large left basal ganglia bleed with extension into the lateral ventricles, compression of the third ventricle, 1.5 cm midline shift  SIGNIFICANT EVENTS: 7/16 Transfer from [facility]  SUBJECTIVE / Interval Events:  Note Dr Marlis Edelson discussions with family on 7/17, DNR status  VITAL SIGNS: Temp:  [97.7 F (36.5 C)-98.8 F (37.1 C)] 98.7 F (37.1 C) (07/18 0726) Pulse Rate:  [83-103] 100 (07/18 0800) Resp:  [14-24] 24 (07/18 0800) BP: (119-168)/(67-110) 164/87 mmHg (07/18 0800) SpO2:  [100 %] 100 % (07/18 0800) FiO2 (%):  [40 %] 40 % (07/18 0800) VENTILATOR SETTINGS: Vent Mode:  [-] PSV;CPAP FiO2 (%):  [40 %] 40 % Set Rate:  [14 bmp] 14 bmp Vt Set:  [640 mL] 640 mL PEEP:  [5 cmH20] 5 cmH20 Pressure Support:  [5 cmH20] 5 cmH20 Plateau Pressure:  [15 cmH20-17 cmH20] 16 cmH20 INTAKE / OUTPUT:  Intake/Output Summary (Last 24 hours) at 11/30/14 0859 Last data filed at 11/30/14 0700  Gross per 24 hour  Intake   2370 ml  Output   1072 ml  Net   1298 ml    PHYSICAL EXAMINATION: General: not on sedation HEENT: Pupils pinpoint, Lt ward eye gaze, opened eyes to voice, ETT in place Lungs: no wheeze Cardiovascular: no edema Abdomen: soft, non tender Musculoskeletal: No edema Neuro: comatose Skin: no rashes  LABS:  CBC  Recent Labs Lab 11/23/2014 1639 11/30/14 0218  WBC 5.1 13.6*  HGB 12.8* 11.9*  HCT 38.5* 35.5*  PLT 180 200   Coag's  Recent Labs Lab 11/17/2014 1639  APTT 28  INR 1.09   BMET  Recent Labs Lab 12/10/2014 1639 11/30/14 0218  NA 133* 139  K  4.4 4.3  CL 103 108  CO2 22 18*  BUN 30* 37*  CREATININE 1.18 2.69*  GLUCOSE 330* 227*   Electrolytes  Recent Labs Lab 11/23/2014 1639 11/30/14 0218  CALCIUM 8.3* 8.7*   ABG  Recent Labs Lab 12/05/2014 1725 12/11/2014 2320  PHART 7.22* 7.354  PCO2ART 48 38.4  PO2ART 599* 176*   Liver Enzymes  Recent Labs Lab 12/05/2014 1639  AST 28  ALT 24  ALKPHOS 110  BILITOT 0.5  ALBUMIN 3.1*   Cardiac Enzymes  Recent Labs Lab 11/23/2014 1639  TROPONINI <0.03   Glucose  Recent Labs Lab 11/29/14 1201 11/29/14 1606 11/29/14 2006 11/29/14 2343 11/30/14 0340 11/30/14 0729  GLUCAP 108* 170* 156* 173* 214* 222*    Imaging Dg Chest Port 1 View  11/30/2014   CLINICAL DATA:  64 year old male admitted with intracranial hemorrhage  EXAM: PORTABLE CHEST - 1 VIEW  COMPARISON:  11/17/2014, 02/04/2013  FINDINGS: Right rotation.  Relatively unchanged endotracheal tube, terminating suitably above the carina.  Interval placement of gastric tube. The tip terminates in the region of the stomach gas, though the side port appears to terminate above the GE junction.  Interval development of right basilar opacity, partially obscuring the right hemidiaphragm in the right heart border.  Unchanged cardiomediastinal silhouette without evidence of central vascular congestion.  No pneumothorax.  Left lung relatively well aerated.  IMPRESSION: Interval  placement of gastric tube which terminates in the region of the stomach gas. The side port appears to terminate above the GE junction, and the tube may be advanced several cm for better position.  Unchanged endotracheal tube.  Interval development of opacity at the right base, which may represent atelectasis and/or consolidation/aspiration.  These results were called by telephone at the time of interpretation on 11/30/2014 at 7:26 am to the nurse caring for the patient, Toma CopierBethany, who verbally acknowledged these results.  Signed,  Yvone NeuJaime S. Loreta AveWagner, DO  [facility]  [facility]   Electronically Signed   By: Gilmer MorJaime  Wagner D.O.   On: 11/30/2014 07:26     ASSESSMENT / PLAN:  PULMONARY ETT 7/16 >> A:  Compromised airway 2nd to ICH. P:   Pressure support wean as tolerated >> mental status precludes extubation  Based on discussions with Neurology suspect that we will withdraw care. I will be happy to assist with the orders when the family has reached a decision  Defer further CXR's if no plans for continued vent support  CARDIOVASCULAR A:  HTN emergency. Hx of DVT. P:  Cardene drip with goal MAP 110-120 or SBP 160-180 per neurology; will turn off at time of withdrawal  RENAL A:   Acute renal failure P:   Stop checking labs 7/18  GASTROINTESTINAL A:  Nutrition. P:   Defer TF  HEMATOLOGIC A:   History of DVT and Xarelto >> treated with K centra. P:  No further interventions   INFECTIOUS A:   No evidence for infection. P:   Defer abx  ENDOCRINE A:   DM type II. P:   SSI, stop at time of w/d  NEUROLOGIC A:   Massive left ICH with shift >> family opted against CVL placement for 3% NS. Hx of seizures. P:   RASS goal: 0 AED's per neurology  Summary: Admitted w massive ICH, VDRF. Good discussion with pt's wife and niece 7/18 regarding prognosis and goals. Confirms the prior discussions by neurology. Plan is for withdrawal of care when family is gathered and ready today.   Independent CC time 35 minutes.    Levy Pupaobert Iren Whipp, MD, PhD 11/30/2014, 9:24 AM [facility] [phone] or if no answer [phone]

## 2014-11-30 NOTE — Progress Notes (Signed)
SLP Cancellation Note  Patient Details Name: Andrew Dillon MRN: [id number] DOB: [birth year]   Cancelled treatment:       Reason Eval/Treat Not Completed: Patient not medically ready. Will sign off.    Isha Seefeld, Riley NearingBonnie Caroline 11/30/2014, 7:28 AM

## 2014-11-30 NOTE — Progress Notes (Signed)
PT Cancellation Note  Patient Details Name: Andrew Dillon MRN: [id number] DOB: June 13, 1950   Cancelled Treatment:    Reason Eval/Treat Not Completed: PT screened, no needs identified, will sign off.  Pt moving toward comfort measures only.  Will sign off at this time. 11/30/2014  Andrew Dillon, PT [phone]  (pager)   Andrew Dillon, Andrew Dillon 11/30/2014, 10:28 AM

## 2014-11-30 NOTE — Consult Note (Signed)
Consultation Note Date: 11/30/2014   Patient Name: Andrew Dillon  DOB: 1950/10/12  MRN: [id number]  Age / Sex: 64 y.o., male   PCP: Adin Hector, MD Referring Physician: Rosalin Hawking, MD  Reason for Consultation: Establishing goals of care  Palliative Care Assessment and Plan Summary of Established Goals of Care and Medical Treatment Preferences  History of presenting illness: 64 yo male on xarelto for DVT presented with altered mental status from HTN emergency and ICH. past medical history significant for HTN, DM, DVT on xarelto, and predominantly nocturnal GTC seizures, transferred to [facility] for further management of left ICH. Patient is currently intubated on the vent, and her daughter and bother at the bedside are providing the pertinent clinical information. According to wife, she left the house around 8 am today and he was normal. Then, she went to work and she came back home he was unresponsive on the floor. At that time he was able to roll himself over towards his right side, he did seem to move his left arm. EMS was summoned and upon arrival to [facility] he was noted to have a SBP>230. CT brain was urgently obtained and revealed a pretty large left basal ganglia intraparenchymal hemorrhage with extension into the lateral ventricles, with mass-effect approximately 1.5 cm left-to-right midline shift but no frank hydrocephalus. Patient was also treated with K centra as he has been on xarelto for DVT, and started on IV nicardipine infusion. Serologies significant for PTT 28, INR 1.09, platelets 273  Hospital Course:   the patient showed no signs of clinical improvement, patient was compassionately extubated by pulm CCM team, palliative consult placed for further discussions. Met with the patient's wife, brief life review performed, the patient has been with his wife for the past 35 years, they don't have any children, they have 4 dogs. The patient's wife states she is at peace with his  passing, she understands that he has suffered a serious life limiting condition.   Discussed comfort measures only. Discussed prognosis hours to days. Discussed initiating hospice discussions in am if still appropriate.   PLAN: D/C maintenance IVF Atropine drops for rattle, victoria congestion scale 2.  Morphine infusion to be titrated upwards for comfort care.  DNR DNI re confirmed.   Contacts/Participants in Discussion: Primary Decision Maker: wife     HCPOA: yes     Code Status/Advance Care Planning:  Wife is Media planner, no children  DNR DNI  Symptom Management:   Morphine drip.   Palliative Prophylaxis: yes  Additional Recommendations (Limitations, Scope, Preferences):   consider hospice consult [facility] transfer to [facility] place if appropriate in am. Patient with increased secretions and hypoxia this pm, might be imminently dying.  Psycho-social/Spiritual:   Support System: yes,   Desire for further Chaplaincy support:no, but wife is reading to him from the bible.   Prognosis: Hours - Days  Discharge Planning:  hospital death versus hospice arrangements.    Values: comfort based approach to care  Life limiting illness: Hope      Chief Complaint/History of Present Illness: brain bleed   Primary Diagnoses  Present on Admission:  . Cerebral parenchymal hemorrhage  Palliative Review of Systems: noted I have reviewed the medical record, interviewed the patient and family, and examined the patient. The following aspects are pertinent.  Past Medical History  Diagnosis Date  . Diabetes mellitus without complication   . Seizures   . Hypertension   . H/O blood clots    History  Social History  . Marital Status: Married    Spouse Name: N/A  . Number of Children: N/A  . Years of Education: N/A   Social History Main Topics  . Smoking status: Current Every Day Smoker -- 1.00 packs/day    Types: Cigarettes  . Smokeless tobacco: Not on file  . Alcohol  Use: No  . Drug Use: No  . Sexual Activity: Not on file   Other Topics Concern  . None   Social History Narrative   History reviewed. No pertinent family history. Scheduled Meds: . antiseptic oral rinse  7 mL Mouth Rinse QID  . chlorhexidine  15 mL Mouth Rinse BID  . levETIRAcetam  500 mg Intravenous Q12H   Continuous Infusions: . sodium chloride 75 mL/hr at 11/29/14 1818  . morphine 1 mg/hr (11/30/14 1200)  . niCARDipine Stopped (11/30/14 1022)   PRN Meds:.acetaminophen **OR** acetaminophen, labetalol, LORazepam, morphine Medications Prior to Admission:  Prior to Admission medications   Medication Sig Start Date End Date Taking? Authorizing Provider  Insulin Detemir (LEVEMIR FLEXPEN) 100 UNIT/ML Pen Inject 20 Units into the skin every evening. 07/03/14  Yes Historical Provider, MD  insulin detemir (LEVEMIR) 100 unit/ml SOLN Inject 20 Units into the skin at bedtime as needed. 07/03/14  Yes Historical Provider, MD  insulin lispro (HUMALOG KWIKPEN) 100 UNIT/ML KiwkPen Inject 5 Units into the skin 3 (three) times daily before meals. Add an extra 2 - 10 units before meals as directed for high blood sugars. 07/03/14  Yes Historical Provider, MD  amLODipine (NORVASC) 5 MG tablet Take 5 mg by mouth daily. 10/28/14   Historical Provider, MD  cetirizine (ZYRTEC) 10 MG tablet Take 10 mg by mouth daily.    Historical Provider, MD  escitalopram (LEXAPRO) 10 MG tablet Take 10 mg by mouth daily. 10/30/14   Historical Provider, MD  hydrochlorothiazide (HYDRODIURIL) 25 MG tablet Take 25 mg by mouth daily. 11/06/14   Historical Provider, MD  levETIRAcetam (KEPPRA) 500 MG tablet Take 500 mg by mouth 2 (two) times daily. 11/04/14   Historical Provider, MD  losartan (COZAAR) 100 MG tablet Take 100 mg by mouth daily. 11/04/14   Historical Provider, MD  metoprolol succinate (TOPROL-XL) 25 MG 24 hr tablet Take 25 mg by mouth daily. 10/28/14   Historical Provider, MD  Vitamin D, Ergocalciferol, (DRISDOL) 50000  UNITS CAPS capsule Take 50,000 Units by mouth once a week. 11/24/14   Historical Provider, MD  XARELTO 20 MG TABS tablet Take 20 mg by mouth daily. 11/10/14   Historical Provider, MD   Allergies  Allergen Reactions  . Metformin And Related    CBC:    Component Value Date/Time   WBC 13.6* 11/30/2014 0218   WBC 5.2 11/28/2013 1513   HGB 11.9* 11/30/2014 0218   HGB 11.0* 11/28/2013 1513   HCT 35.5* 11/30/2014 0218   HCT 33.5* 11/28/2013 1513   PLT 200 11/30/2014 0218   PLT 273 11/28/2013 1513   MCV 89.0 11/30/2014 0218   MCV 96 11/28/2013 1513   NEUTROABS 4.2 11/29/2014 1639   NEUTROABS 5.5 02/07/2013 0543   LYMPHSABS 0.6* 11/21/2014 1639   LYMPHSABS 0.9* 02/07/2013 0543   MONOABS 0.3 12/11/2014 1639   MONOABS 1.0 02/07/2013 0543   EOSABS 0.0 12/01/2014 1639   EOSABS 0.0 02/07/2013 0543   BASOSABS 0.1 11/16/2014 1639   BASOSABS 0.0 02/07/2013 0543   Comprehensive Metabolic Panel:    Component Value Date/Time   NA 139 11/30/2014 0218   NA 131* 11/28/2013 1513  K 4.3 11/30/2014 0218   K 4.8 11/28/2013 1513   CL 108 11/30/2014 0218   CL 104 11/28/2013 1513   CO2 18* 11/30/2014 0218   CO2 23 11/28/2013 1513   BUN 37* 11/30/2014 0218   BUN 38* 11/28/2013 1513   CREATININE 2.69* 11/30/2014 0218   CREATININE 1.87* 11/28/2013 1513   GLUCOSE 227* 11/30/2014 0218   GLUCOSE 228* 11/28/2013 1513   CALCIUM 8.7* 11/30/2014 0218   CALCIUM 8.5 11/28/2013 1513   AST 28 12/12/2014 1639   AST 43* 11/28/2013 1513   ALT 24 12/05/2014 1639   ALT 47 11/28/2013 1513   ALKPHOS 110 11/29/2014 1639   ALKPHOS 96 11/28/2013 1513   BILITOT 0.5 12/07/2014 1639   PROT 6.2* 11/26/2014 1639   PROT 7.1 11/28/2013 1513   ALBUMIN 3.1* 12/04/2014 1639   ALBUMIN 3.2* 11/28/2013 1513    Physical Exam: Vital Signs: BP 163/76 mmHg  Pulse 98  Temp(Src) 99 F (37.2 C) (Axillary)  Resp 22  Ht _0  (1.854 m)  Wt 69.6 kg (153 lb 7 oz)  BMI 20.25 kg/m2  SpO2 93% SpO2: SpO2: 93 % O2 Device: O2  Device: Not Delivered O2 Flow Rate:   Intake/output summary:  Intake/Output Summary (Last 24 hours) at 11/30/14 1414 Last data filed at 11/30/14 1250  Gross per 24 hour  Intake 2439.5 ml  Output    767 ml  Net 1672.5 ml   LBM: Last BM Date: 11/30/14 Baseline Weight: Weight: 69.6 kg (153 lb 7 oz) Most recent weight: Weight: 69.6 kg (153 lb 7 oz)  Exam Findings:  Unresponsive, facial droop Clear S1S2 Abdomen soft No edema extremities warm to touch                       Palliative Performance Scale: 10% Additional Data Reviewed: Recent Labs     11/18/2014  1639  11/30/14  0218  WBC  5.1  13.6*  HGB  12.8*  11.9*  PLT  180  200  NA  133*  139  BUN  30*  37*  CREATININE  1.18  2.69*     Time In: 1400 Time Out: 1455 Time Total: 55 min Greater than 50%  of this time was spent counseling and coordinating care related to the above assessment and plan.  Signed by: Loistine Chance, MD [place], MD  11/30/2014, 2:14 PM  Please contact Palliative Medicine Team phone at [phone] for questions and concerns.

## 2014-11-30 NOTE — Progress Notes (Signed)
STROKE TEAM PROGRESS NOTE   HISTORY Andrew Dillon is an 64 y.o. male with a past medical history significant for HTN, DM, DVT on xarelto, and predominantly nocturnal GTC seizures, [facility] management [facility] left ICH. Patient is currently intubated on the vent, and her daughter and bother at the bedside are providing the pertinent clinical information. According to wife, she left the house around 8 am today and he was normal. Then, she went to work and she came back home he was unresponsive on the floor. At that time he was able to roll himself over towards his right side, he did seem to move his left arm. EMS was summoned and upon arrival to [facility] he was noted to have a SBP>230. CT brain was urgently obtained and revealed a pretty large left basal ganglia intraparenchymal hemorrhage with extension into the lateral ventricles, with mass-effect approximately 1.5 cm left-to-right midline shift but no frank hydrocephalus. Patient was also treated with K centra as he has been on xarelto for DVT, and started on IV nicardipine infusion. Serologies significant for PTT 28, INR 1.09, platelets 273  Date last known well: 2015-01-04 Time last known well: 8 am tPA Given: no, ICH  SUBJECTIVE (INTERVAL HISTORY) Family members at bedside. He was able to open eyes on voice but did not follow any comments. Still global aphasic and intubated. Dr. Delton CoombesByrum and I discussed with family and they all request comfort care. Will extubate once family ready.   OBJECTIVE Temp:  [97.7 F (36.5 C)-99 F (37.2 C)] 99 F (37.2 C) (07/18 1200) Pulse Rate:  [87-105] 98 (07/18 1200) Cardiac Rhythm:  [-] Normal sinus rhythm (07/17 1945) Resp:  [14-31] 22 (07/18 1200) BP: (119-171)/(64-96) 163/76 mmHg (07/18 1100) SpO2:  [93 %-100 %] 93 % (07/18 1200) FiO2 (%):  [40 %] 40 % (07/18 0900)   Recent Labs Lab 11/29/14 2006 11/29/14 2343 11/30/14 0340 11/30/14 0729 11/30/14 1146  GLUCAP 156* 173* 214* 222*  200*    Recent Labs Lab 02016-08-22 1639 11/30/14 0218  NA 133* 139  K 4.4 4.3  CL 103 108  CO2 22 18*  GLUCOSE 330* 227*  BUN 30* 37*  CREATININE 1.18 2.69*  CALCIUM 8.3* 8.7*    Recent Labs Lab 02016-08-22 1639  AST 28  ALT 24  ALKPHOS 110  BILITOT 0.5  PROT 6.2*  ALBUMIN 3.1*    Recent Labs Lab 02016-08-22 1639 11/30/14 0218  WBC 5.1 13.6*  NEUTROABS 4.2  --   HGB 12.8* 11.9*  HCT 38.5* 35.5*  MCV 90.7 89.0  PLT 180 200    Recent Labs Lab 02016-08-22 1639  TROPONINI <0.03    Recent Labs  02016-08-22 1639  LABPROT 14.3  INR 1.09    Recent Labs  02016-08-22 1639  COLORURINE YELLOW*  LABSPEC 1.012  PHURINE 5.0  GLUCOSEU >500*  HGBUR 2+*  BILIRUBINUR NEGATIVE  KETONESUR TRACE*  PROTEINUR >500*  NITRITE NEGATIVE  LEUKOCYTESUR NEGATIVE    No results found for: CHOL, TRIG, HDL, CHOLHDL, VLDL, LDLCALC No results found for: HGBA1C No results found for: LABOPIA, COCAINSCRNUR, LABBENZ, AMPHETMU, THCU, LABBARB  No results for input(s): ETH in the last 168 hours.   Imaging  Dg Chest 1 View 2014-08-29    Endotracheal tube terminates 6 cm above the carina.   Ct Head Wo Contrast 2014-08-29    Large left basal ganglia intraparenchymal hemorrhage with extension into the lateral ventricles. There is mass-effect approximately 1.5 cm left-to-right midline shift.  Ct Head W Contrast 11/29/2014    1. Stable to slight enlargement of large intra-axial left hemisphere hemorrhage since yesterday. Estimated intra-axial blood volume is 137 mL.  2. Stable intraventricular extension. No ventriculomegaly. Trace subarachnoid hemorrhage in the right sylvian fissure.  3. Rightward midline shift is stable at 14 mm. Basilar cisterns remain patent.  4. No new intracranial abnormality identified.     Ct Cervical Spine Wo Contrast 11/22/2014    No acute cervical spine fracture.    PHYSICAL EXAM Middle-aged African-American male who is intubated but not sedated. . Afebrile. Head  is nontraumatic. Neck is supple without bruit.    Cardiac exam no murmur or gallop. Lungs are clear to auscultation. Distal pulses are well felt.  Neurological Exam :  Very lethargic and d. Does not follow commands. Left gaze preference but some spontaneous side to side movements up to midline. Pupils 2 mm reactive. Corneal reflexes are present. Fundi could not be visualized. Right lower facial weakness. Dense right hemiplegia with hypotonia. Withdraws left upper and lower extremities purposefully and does have some spontaneous movements on the left as well. Right plantar upgoing left downgoing.  ASSESSMENT/PLAN Andrew Dillon is a 64 y.o. male with history of diabetes mellitus, seizure disorder, hypertension, and a history of DVT on Xarelto presenting with unresponsiveness and elevated blood pressure. He did not receive IV t-PA due to ICH. Due to poor prognosis, family requested comfort care.  Stroke:  Dominant - Large left basal ganglia intraparenchymal hemorrhage with intraventricular extension and midline shift.  Resultant lethargic, global aphasia, left gaze and right hemiplegia and intubated.  MRI not performed  MRA  not performed  CT - Large left basal ganglia intraparenchymal hemorrhage with extension into the lateral ventricles.   Carotid Doppler cancelled due to comfort care  2D Echo cancelled due to comfort care  LDL cancelled due to comfort care  HgbA1c cancelled due to comfort care Diet NPO time specified  xarelto ( rivaroxaban) prior to admission, now on no antithrombotic secondary to ICH  Will extubate for comfort care once family ready  Palliative care consult requested.  Cerebral edema  Midline shift confirmed on CT  Poor prognosis  Family requested comfort care  Palliative care consult requested  Hypertension  Home meds: Norvasc, HCTZ, Cozaar, and Toprol  Stable  Comfort care measure  Diabetes  HgbA1c not ordered, goal <  7.0  Uncontrolled  Other Stroke Risk Factors  Advanced age  Other Active Problems  Intubated on ventilator - will be extubated for comfort care  Other Pertinent History  Seizure disorder - Keppra prior to admission  Hospital day # 2  Marvel Plan, MD PhD Stroke Neurology 11/30/2014 3:50 PM     To contact Stroke Continuity provider, please refer to [email or web address]. After hours, contact General Neurology

## 2014-12-01 DIAGNOSIS — G936 Cerebral edema: Secondary | ICD-10-CM

## 2014-12-01 DIAGNOSIS — I639 Cerebral infarction, unspecified: Secondary | ICD-10-CM | POA: Insufficient documentation

## 2014-12-01 NOTE — Progress Notes (Signed)
PCCM Interval Note  Patient extubated for comfort. No distress noted.   Please call if we can assist you in any way  Levy Pupaobert Riann Oman, MD, PhD 12/01/2014, 9:20 AM [facility] [phone] or if no answer [phone]

## 2014-12-01 NOTE — Care Management Note (Signed)
Case Management Note  Patient Details  Name: Andrew Dillon MRN: [id number] Date of Birth: 1950/11/25  Subjective/Objective:   Pt extubated for comfort this AM, per family's wishes.                   Action/Plan: CSW following for possible residential Hospice placement.  Will follow progress.    Expected Discharge Date:                  Expected Discharge Plan:  Hospice Medical Facility  In-House Referral:     Discharge planning Services  CM Consult  Post Acute Care Choice:    Choice offered to:     DME Arranged:    DME Agency:     HH Arranged:    HH Agency:     Status of Service:  In process, will continue to follow  Medicare Important Message Given:    Date Medicare IM Given:    Medicare IM give by:    Date Additional Medicare IM Given:    Additional Medicare Important Message give by:     If discussed at Long Length of Stay Meetings, dates discussed:    Additional Comments:  Quintella BatonJulie W. Amiel Mccaffrey, RN, BSN  Trauma/Neuro ICU Case Manager [phone]

## 2014-12-01 NOTE — Progress Notes (Signed)
STROKE TEAM PROGRESS NOTE   HISTORY Andrew Dillon is an 64 y.o. male with a past medical history significant for HTN, DM, DVT on xarelto, and predominantly nocturnal GTC seizures, [facility] management of left ICH. Patient is currently intubated on the vent, and her daughter and bother at the bedside are providing the pertinent clinical information. According to wife, she left the house around 8 am today and he was normal. Then, she went to work and she came back home he was unresponsive on the floor. At that time he was able to roll himself over towards his right side, he did seem to move his left arm. EMS was summoned and upon arrival to [facility] he was noted to have a SBP>230. CT brain was urgently obtained and revealed a pretty large left basal ganglia intraparenchymal hemorrhage with extension into the lateral ventricles, with mass-effect approximately 1.5 cm left-to-right midline shift but no frank hydrocephalus. Patient was also treated with K centra as he has been on xarelto for DVT, and started on IV nicardipine infusion. Serologies significant for PTT 28, INR 1.09, platelets 273  Date last known well: 11/24/2014 Time last known well: 8 am tPA Given: no, ICH  SUBJECTIVE (INTERVAL HISTORY) Wife is at bedside. Pt is calm, unresponsive. Plan to transfer to [facility] and then transfer to residential hospice.    OBJECTIVE Pulse Rate:  [102-115] 105 (07/19 0404) Cardiac Rhythm:  [-]  Resp:  [21-32] 21 (07/19 0404) BP: (109-183)/(54-88) 109/54 mmHg (07/19 0404) SpO2:  [51 %-86 %] 53 % (07/19 0404)   Recent Labs Lab 11/29/14 2006 11/29/14 2343 11/30/14 0340 11/30/14 0729 11/30/14 1146  GLUCAP 156* 173* 214* 222* 200*    Recent Labs Lab 12/06/2014 1639 11/30/14 0218  NA 133* 139  K 4.4 4.3  CL 103 108  CO2 22 18*  GLUCOSE 330* 227*  BUN 30* 37*  CREATININE 1.18 2.69*  CALCIUM 8.3* 8.7*    Recent Labs Lab 12/08/2014 1639  AST 28  ALT 24  ALKPHOS 110   BILITOT 0.5  PROT 6.2*  ALBUMIN 3.1*    Recent Labs Lab 12/12/2014 1639 11/30/14 0218  WBC 5.1 13.6*  NEUTROABS 4.2  --   HGB 12.8* 11.9*  HCT 38.5* 35.5*  MCV 90.7 89.0  PLT 180 200    Recent Labs Lab 11/16/2014 1639  TROPONINI <0.03    Recent Labs  11/13/2014 1639  LABPROT 14.3  INR 1.09    Recent Labs  11/19/2014 1639  COLORURINE YELLOW*  LABSPEC 1.012  PHURINE 5.0  GLUCOSEU >500*  HGBUR 2+*  BILIRUBINUR NEGATIVE  KETONESUR TRACE*  PROTEINUR >500*  NITRITE NEGATIVE  LEUKOCYTESUR NEGATIVE    No results found for: CHOL, TRIG, HDL, CHOLHDL, VLDL, LDLCALC No results found for: HGBA1C No results found for: LABOPIA, COCAINSCRNUR, LABBENZ, AMPHETMU, THCU, LABBARB  No results for input(s): ETH in the last 168 hours.   Imaging  Dg Chest 1 View 12/04/2014    Endotracheal tube terminates 6 cm above the carina.   Ct Head Wo Contrast 11/29/2014    Large left basal ganglia intraparenchymal hemorrhage with extension into the lateral ventricles. There is mass-effect approximately 1.5 cm left-to-right midline shift.     Ct Head W Contrast 11/29/2014    1. Stable to slight enlargement of large intra-axial left hemisphere hemorrhage since yesterday. Estimated intra-axial blood volume is 137 mL.  2. Stable intraventricular extension. No ventriculomegaly. Trace subarachnoid hemorrhage in the right sylvian fissure.  3. Rightward midline  shift is stable at 14 mm. Basilar cisterns remain patent.  4. No new intracranial abnormality identified.     Ct Cervical Spine Wo Contrast 11/18/2014    No acute cervical spine fracture.    PHYSICAL EXAM Middle-aged African-American male who is intubated but not sedated. Afebrile. Head is nontraumatic. Neck is supple without bruit.  Cardiac exam no murmur or gallop. Lungs are clear to auscultation. Distal pulses are well felt.  Neurological Exam :  Unresponsive but calm, no distress. Left gaze preference. Pupils 2 mm reactive. Corneal  reflexes are present. Fundi could not be visualized. Right lower facial weakness. Dense right hemiplegia with hypotonia. No spontaneous movements. Neuro exam limited due to comfort care.  ASSESSMENT/PLAN Andrew Dillon is a 64 y.o. male with history of diabetes mellitus, seizure disorder, hypertension, and a history of DVT on Xarelto presenting with unresponsiveness and elevated blood pressure. He did not receive IV t-PA due to ICH. Due to poor prognosis, family requested comfort care.  Stroke:  Dominant - Large left basal ganglia intraparenchymal hemorrhage with intraventricular extension and midline shift.  Resultant lethargic, global aphasia, left gaze and right hemiplegia.  CT - Large left basal ganglia intraparenchymal hemorrhage with extension into the lateral ventricles.  Diet NPO time specified  xarelto ( rivaroxaban) prior to admission, now on no antithrombotic secondary to ICH and comfort care  Palliative care consult requested.  Cerebral edema  Midline shift confirmed on CT  Poor prognosis  Family requested comfort care  Palliative care consult requested  Hypertension  Home meds: Norvasc, HCTZ, Cozaar, and Toprol  Stable  Comfort care measure  Diabetes  HgbA1c not ordered, goal < 7.0  Uncontrolled  Other Stroke Risk Factors  Advanced age  Other Active Problems  Under comfort care measures  Transfer to [facility] and then transfer to residential hospice  Other Pertinent History  Seizure disorder - Keppra prior to admission  Hospital day # 3  Marvel Plan, MD PhD Stroke Neurology 12/01/2014 12:27 PM     To contact Stroke Continuity provider, please refer to [email or web address]. After hours, contact General Neurology

## 2014-12-01 NOTE — Progress Notes (Signed)
Nutrition Brief Note  Chart reviewed. Pt now transitioning to comfort care.  No further nutrition interventions warranted at this time.  Please re-consult as needed.   Kyandra Mcclaine RD, LDN, CNSC [phone] Pager [phone] After Hours Pager    

## 2014-12-01 NOTE — Progress Notes (Signed)
Daily Progress Note   Patient Name: Genoveva IllJames E Whitener       Date: 12/01/2014 DOB: Feb 07, 1951  Age: 64 y.o. MRN#: [id number] Attending Physician: Marvel PlanJindong Xu, MD Primary Care Physician: Lynnea FerrierBERT J KLEIN III, MD Admit Date: 2015-02-28 Life limiting illness: large non traumatic ICH, uncontrolled HTN  Reason for Consultation/Follow-up: Establishing goals of care  Subjective:  resting in bed, unresponsive regular pattern of breathing no distress Interval Events: Hospice consult, consider transfer to [facility] today, discussed with wife. She would like for the patient to be transferred to [facility], she states there are several friends and family members who would like to come visit, she would prefer hospice environment. Wife's sister used to work at [facility].   Length of Stay: 3 days  Current Medications: Scheduled Meds:  . antiseptic oral rinse  7 mL Mouth Rinse QID  . atropine  2 drop Sublingual QID    Continuous Infusions: . morphine 3.2 mg/hr (11/30/14 1800)    PRN Meds: acetaminophen **OR** acetaminophen, labetalol, LORazepam, morphine  Palliative Performance Scale: 10%     Vital Signs: BP 109/54 mmHg  Pulse 105  Temp(Src) 99 F (37.2 C) (Axillary)  Resp 21  Ht 6\' 1"  (1.854 m)  Wt 69.6 kg (153 lb 7 oz)  BMI 20.25 kg/m2  SpO2 53% SpO2: SpO2: (!) 53 % O2 Device: O2 Device: Not Delivered O2 Flow Rate:    Intake/output summary:  Intake/Output Summary (Last 24 hours) at 12/01/14 1033 Last data filed at 12/01/14 0600  Gross per 24 hour  Intake 607.47 ml  Output    310 ml  Net 297.47 ml   LBM:   Baseline Weight: Weight: 69.6 kg (153 lb 7 oz) Most recent weight: Weight: 69.6 kg (153 lb 7 oz)  Physical Exam: Unresponsive, facial droop Clear S1S2 Abdomen soft No edema extremities warm to touch            Additional Data Reviewed: Recent Labs     Oct 07, 2014  1639  11/30/14  0218  WBC  5.1  13.6*  HGB  12.8*  11.9*  PLT  180  200  NA  133*   139  BUN  30*  37*  CREATININE  1.18  2.69*     Problem List:  Patient Active Problem List   Diagnosis Date Noted  . Encounter for palliative care   . Cytotoxic brain edema   . Cerebral parenchymal hemorrhage 02016-10-16  . Acute respiratory failure, unspecified whether with hypoxia or hypercapnia   . Hypertensive emergency      Palliative Care Assessment & Plan    Code Status:  DNR  Goals of Care:   continue morphine infusion  Hospice consult, transfer to inpatient hospice  Desire for further Chaplaincy support:no  3. Symptom Management:   morphine infusion  4. Palliative Prophylaxis:  Stool Softener: patient actively dying, consider if appropriate.   5. Prognosis: Hours - Days  5. Discharge Planning: Hospice facility   Care plan was discussed with  Patient's wife   Thank you for allowing the Palliative Medicine Team to assist in the care of this patient.   Time In: 1000 Time Out: 1025 Total Time 25 Prolonged Time Billed  no     Greater than 50%  of this time was spent counseling and coordinating care related to the above assessment and plan. [phone]   Rosalin HawkingZeba Azaylia Fong, MD  12/01/2014, 10:33 AM  Please contact Palliative Medicine Team phone at [phone] for questions  and concerns.

## 2014-12-01 NOTE — Progress Notes (Signed)
OT Cancellation and Discharge Note  Patient Details Name: Andrew Dillon MRN: [id number] DOB: 08/15/1950   Cancelled Treatment:    Reason Eval/Treat Not Completed: Medical issues which prohibited therapy.  Pt comfort, will sign off.   Angelene GiovanniConarpe, Radie Berges M  Terralyn Matsumura [place], OTR/L [phone]  12/01/2014, 10:58 AM

## 2014-12-01 NOTE — Clinical Social Work Note (Signed)
Clinical Social Work Assessment  Patient Details  Name: Andrew Dillon MRN: [id number] Date of Birth: 20-Apr-1951  Date of referral:  12/01/14               Reason for consult:  End of Life/Hospice                Permission sought to share information with:  Family Supports Permission granted to share information::  No  Name::     Andrew HazelLinda Callari  Relationship::  Spouse  Contact Information:  [phone]  Housing/Transportation Living arrangements for the past 2 months:  Single Family Home Source of Information:  Spouse Patient Interpreter Needed:  None Criminal Activity/Legal Involvement Pertinent to Current Situation/Hospitalization:  No - Comment as needed Significant Relationships:  Spouse, Adult Children Lives with:  Spouse Do you feel safe going back to the place where you live?  No ([facility] ) Need for family participation in patient care:  Yes (Comment)  Care giving concerns:  Patient wife present at bedside and [facility].  Patient wife states that her sister used to work [facility] and is familiar with the building and services offered.   Social Worker assessment / plan:  Visual merchandiserClinical Social Worker spoke with patient wife at bedside who states that patient was living at home with her prior to an acute brain bleed.  Patient wife fully understanding of end of life care and [facility].  CSW [facility] a [facility] Forrestine Him(Eva Davis) who states that patient will have a bed available 11/26/2014).  [facility] liaison plans to meet with patient family at bedside at 4pm to complete paperwork for admission.  CSW updated patient wife that patient could potentially transfer to the floor prior [facility].  CSW remains available for support and to facilitate patient discharge needs.  Employment status:  [employer] Insurance information:  Managed Care PT Recommendations:  Not assessed at this  time Information / Referral to community resources:  Other (Comment Required) ([facility]idential [facility]osp[facility])  Patient/Family's Response to care:  Patient wife is hopeful that patient will remain stable for [facility].  Since transitioning to comfort care, patient has remained stable and appropriate for transfer at this time.  MD [facility] prior to [facility].  Patient/Family's Understanding of and Emotional Response to Diagnosis, Current Treatment, and Prognosis:  Patient wife aware of patient poor prognosis and seeking an environment outside the hospital for patient to pass.  Patient wife is coping extraordinarily well for current situation.  Emotional Assessment Appearance:  Appears older than stated age Attitude/Demeanor/Rapport:  Unable to Assess Affect (typically observed):  Unable to Assess Alcohol / Substance use:  Not Applicable Psych involvement (Current and /or in the community):  No (Comment)  Discharge Needs  Concerns to be addressed:  Discharge Planning Concerns Readmission within the last 30 days:  No Current discharge risk:  Terminally ill Barriers to Discharge:  Hospice Bed not available, Continued Medical Work up  MetLifeJesse Hanley Woerner, [place]CSW [phone]

## 2014-12-03 NOTE — Discharge Summary (Signed)
Stroke Discharge Summary  Patient ID: Andrew Dillon   MRN: [id number]      DOB: 1950-12-29  Date of Admission: 12/04/2014 Date of Discharge: 12/03/2014  Attending Physician:  No att. 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  Past Medical History  Diagnosis Date  . Diabetes mellitus without complication   . Seizures   . Hypertension   . H/O blood clots    History reviewed. No pertinent past surgical history.    Medication List    Notice    You have not been prescribed any medications.      LABORATORY STUDIES CBC    Component Value Date/Time   WBC 13.6* 11/30/2014 0218   WBC 5.2 11/28/2013 1513   RBC 3.99* 11/30/2014 0218   RBC 3.47* 11/28/2013 1513   HGB 11.9* 11/30/2014 0218   HGB 11.0* 11/28/2013 1513   HCT 35.5* 11/30/2014 0218   HCT 33.5* 11/28/2013 1513   PLT 200 11/30/2014 0218   PLT 273 11/28/2013 1513   MCV 89.0 11/30/2014 0218   MCV 96 11/28/2013 1513   MCH 29.8 11/30/2014 0218   MCH 31.7 11/28/2013 1513   MCHC 33.5 11/30/2014 0218   MCHC 32.9 11/28/2013 1513   RDW 16.2* 11/30/2014 0218   RDW 15.1* 11/28/2013 1513   LYMPHSABS 0.6* 11/19/2014 1639   LYMPHSABS 0.9* 02/07/2013 0543   MONOABS 0.3 11/27/2014 1639   MONOABS 1.0 02/07/2013 0543   EOSABS 0.0 11/27/2014 1639   EOSABS 0.0 02/07/2013 0543   BASOSABS 0.1 11/17/2014 1639   BASOSABS 0.0 02/07/2013 0543   CMP    Component Value Date/Time   NA 139 11/30/2014 0218   NA 131* 11/28/2013 1513   K 4.3 11/30/2014 0218   K 4.8 11/28/2013 1513   CL 108 11/30/2014 0218   CL 104 11/28/2013 1513   CO2 18* 11/30/2014 0218   CO2 23 11/28/2013 1513   GLUCOSE 227*  11/30/2014 0218   GLUCOSE 228* 11/28/2013 1513   BUN 37* 11/30/2014 0218   BUN 38* 11/28/2013 1513   CREATININE 2.69* 11/30/2014 0218   CREATININE 1.87* 11/28/2013 1513   CALCIUM 8.7* 11/30/2014 0218   CALCIUM 8.5 11/28/2013 1513   PROT 6.2* 11/14/2014 1639   PROT 7.1 11/28/2013 1513   ALBUMIN 3.1* 11/21/2014 1639   ALBUMIN 3.2* 11/28/2013 1513   AST 28 12/05/2014 1639   AST 43* 11/28/2013 1513   ALT 24 11/19/2014 1639   ALT 47 11/28/2013 1513   ALKPHOS 110 11/17/2014 1639   ALKPHOS 96 11/28/2013 1513   BILITOT 0.5 12/03/2014 1639   BILITOT 0.3 11/28/2013 1513   GFRNONAA 24* 11/30/2014 0218   GFRNONAA 38* 11/28/2013 1513   GFRAA 27* 11/30/2014 0218   GFRAA 44* 11/28/2013 1513   COAGS Lab Results  Component Value Date   INR 1.09 11/29/2014   INR 1.0 02/04/2013   INR 0.9 11/14/2011   Lipid PanelNo results found for: CHOL, TRIG, HDL, CHOLHDL, VLDL, LDLCALC HgbA1C No results found for: HGBA1C Cardiac Panel (last 3 results) No results for input(s): CKTOTAL, CKMB, TROPONINI, RELINDX in the last 72 hours. Urinalysis    Component Value Date/Time  COLORURINE YELLOW* 12/06/2014 1639   COLORURINE Yellow 11/28/2013 1730   APPEARANCEUR HAZY* 12/01/2014 1639   APPEARANCEUR Clear 11/28/2013 1730   LABSPEC 1.012 12/11/2014 1639   LABSPEC 1.013 11/28/2013 1730   PHURINE 5.0 12/13/2014 1639   PHURINE 5.0 11/28/2013 1730   GLUCOSEU >500* 11/13/2014 1639   GLUCOSEU Negative 11/28/2013 1730   HGBUR 2+* 11/27/2014 1639   HGBUR 1+ 11/28/2013 1730   BILIRUBINUR NEGATIVE 11/25/2014 1639   BILIRUBINUR Negative 11/28/2013 1730   KETONESUR TRACE* 11/19/2014 1639   KETONESUR Negative 11/28/2013 1730   PROTEINUR >500* 12/11/2014 1639   PROTEINUR 100 mg/dL 16/02/9603 5409   NITRITE NEGATIVE 11/16/2014 1639   NITRITE Negative 11/28/2013 1730   LEUKOCYTESUR NEGATIVE 12/03/2014 1639   LEUKOCYTESUR Negative 11/28/2013 1730   Urine Drug Screen No results found for: LABOPIA, COCAINSCRNUR,  LABBENZ, AMPHETMU, THCU, LABBARB  Alcohol Level No results found for: [employer]   SIGNIFICANT DIAGNOSTIC STUDIES Dg Chest 1 View 11/13/2014  Endotracheal tube terminates 6 cm above the carina.   Ct Head Wo Contrast 12/01/2014  Large left basal ganglia intraparenchymal hemorrhage with extension into the lateral ventricles. There is mass-effect approximately 1.5 cm left-to-right midline shift.   Ct Head W Contrast 11/29/2014  1. Stable to slight enlargement of large intra-axial left hemisphere hemorrhage since yesterday. Estimated intra-axial blood volume is 137 mL.  2. Stable intraventricular extension. No ventriculomegaly. Trace subarachnoid hemorrhage in the right sylvian fissure.  3. Rightward midline shift is stable at 14 mm. Basilar cisterns remain patent.  4. No new intracranial abnormality identified.   Ct Cervical Spine Wo Contrast 12/04/2014  No acute cervical spine fracture    HISTORY OF PRESENT ILLNESS Andrew Dillon is an 64 y.o. male with a past medical history significant for HTN, DM, DVT on xarelto, and predominantly nocturnal GTC seizures, transferred [facility] for further management of left ICH. Patient is currently intubated on the vent, and her daughter and bother at the bedside are providing the pertinent clinical information. According to wife, she left the house around 8 am today and he was normal. Then, she went to work and she came back home he was unresponsive on the floor. At that time he was able to roll himself over towards his right side, he did seem to move his left arm. EMS was summoned and upon arrival to [facility] he was noted to have a SBP>230. CT brain was urgently obtained and revealed a pretty large left basal ganglia intraparenchymal hemorrhage with extension into the lateral ventricles, with mass-effect approximately 1.5 cm left-to-right midline shift but no frank hydrocephalus. Patient was also treated with K centra as he has been on xarelto  for DVT, and started on IV nicardipine infusion. Serologies significant for PTT 28, INR 1.09, platelets 273  Date last known well: 11/13/2014 Time last known well: 8 am tPA Given: no, ICH   HOSPITAL COURSE Pt was admitted to neuro ICU, remained intubated. Repeat CT showed stable to slight enlargement of ICH with IVH, blood volume 130-140cc and midline shift 14mm. Condition not compatible with life. The second day pt was able to open eyes on voice but not following commands on vent without sedation. Discussed with wife and she clearly stated that pt would not want to live like that and also would not want to prolong the suffering if condition is terminal. Palliative care consult requested and pt was extubated and put on comfort care. Pt passed away on December 17, 2014 00:05 peacefully.   DISCHARGE EXAM Pt deceased.  25 minutes were spent preparing discharge.  Marvel Plan, MD PhD Stroke Neurology 12/03/2014 1:06 PM

## 2014-12-14 NOTE — Progress Notes (Signed)
Pt pronounced dead at 0005 by Geannie RisenJay Daemon Dowty, RN and witnessed by Hulan SaasNancy Caguioa, RN; Morphine drip 150 cc bag wasted on the sink as witnessed by the 2 RN's.

## 2014-12-14 DEATH — deceased

## 2016-07-11 IMAGING — CT CT HEAD W/ CM
1 of 2 series · 15 of 30 positions shown, 19 images · non-contrast
Comparison: Head and cervical spine CT without contrast 11/28/2014,
and earlier

CLINICAL DATA: 63-year-old male with large left hemisphere
intra-axial hemorrhage discovered on CT after found unresponsive.
Initial encounter.

EXAM:
CT HEAD WITHOUT CONTRAST
TECHNIQUE: Contiguous axial images were obtained from the base of the skull
through the vertex without intravenous contrast.

[Series 3: head 2.0 h70h · axial · 0.46mm/px · z∈[+254,+398]mm · 15 of 80 slices shown, 19 images]
[im 4/80  brain]
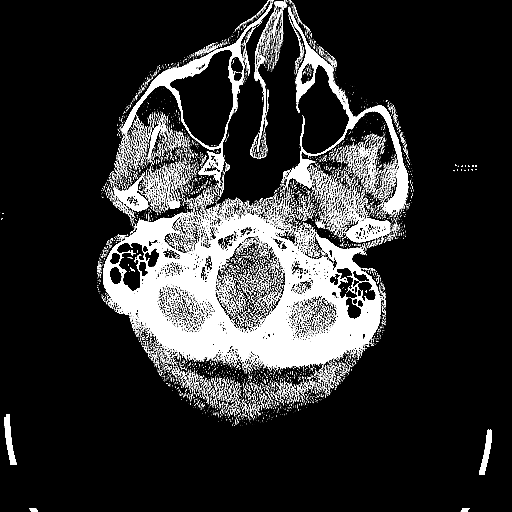
[im 4/80  bone]
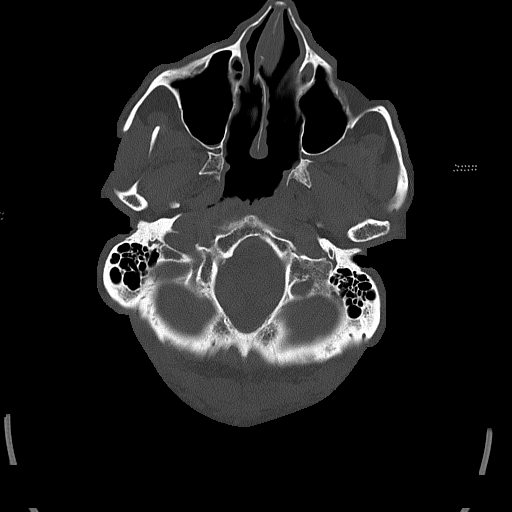
[im 8/80  brain]
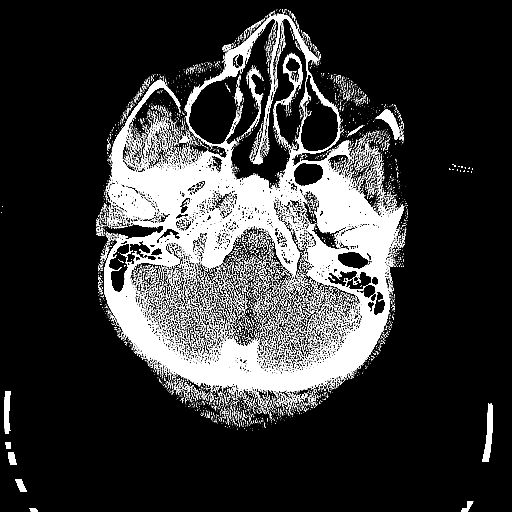
[im 16/80  brain]
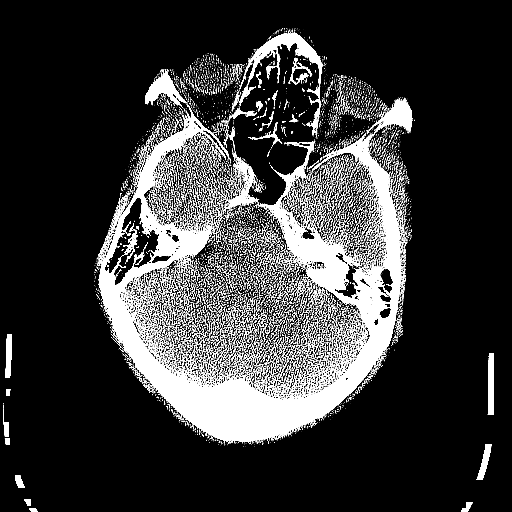
[im 20/80  brain]
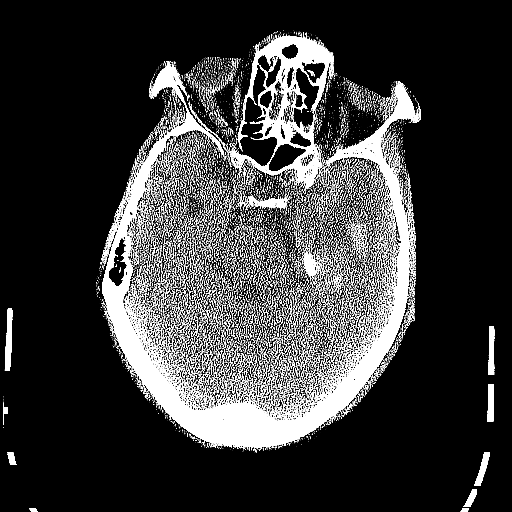
[im 24/80  brain]
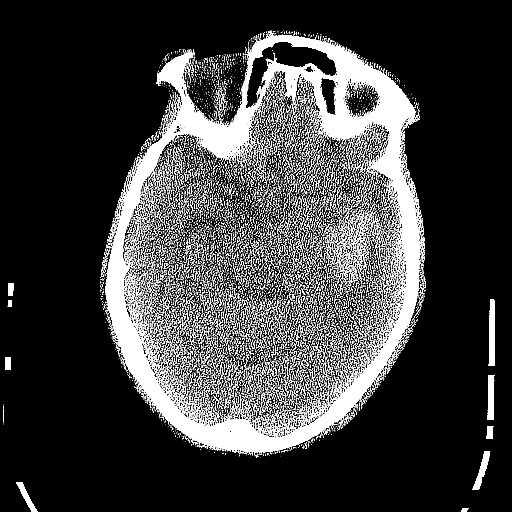
[im 24/80  bone]
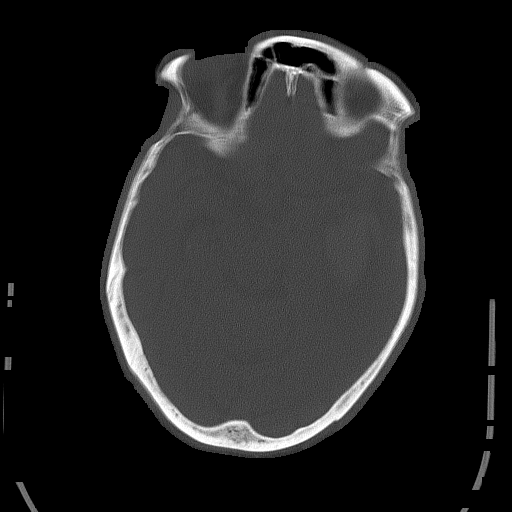
[im 28/80  brain]
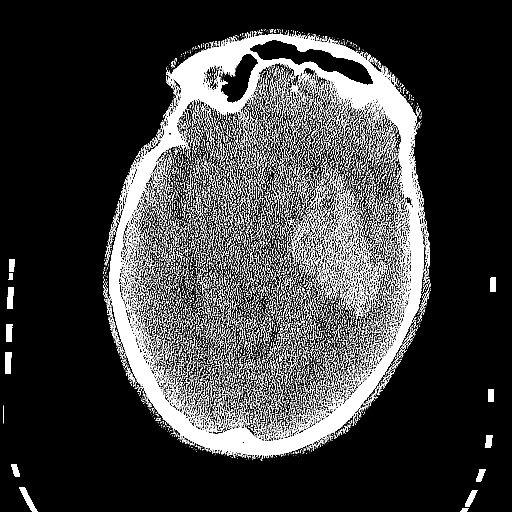
[im 36/80  brain]
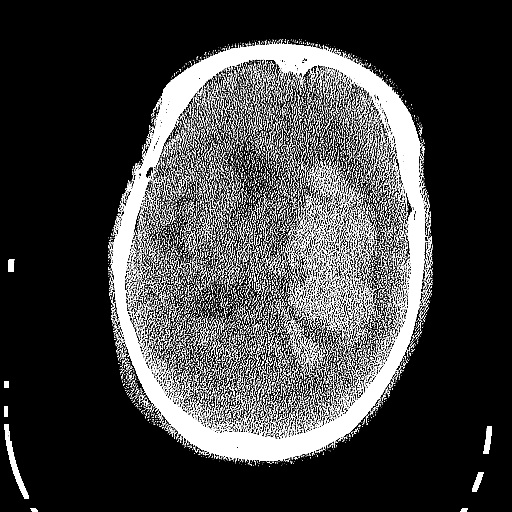
[im 40/80  brain]
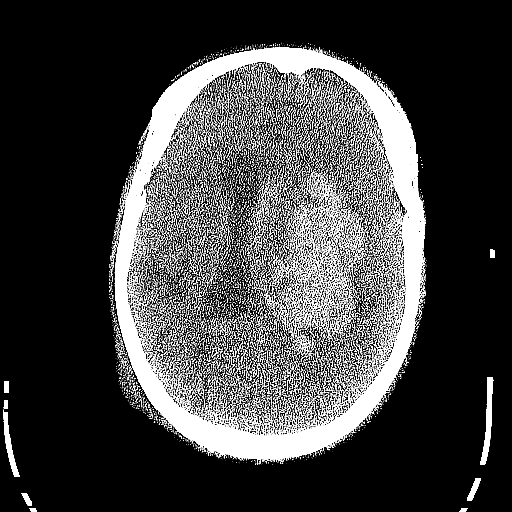
[im 44/80  brain]
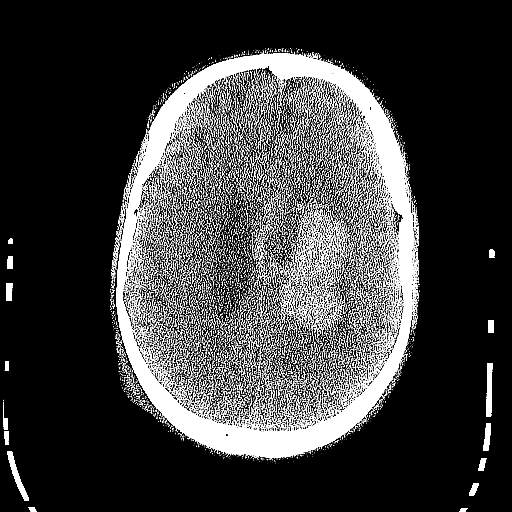
[im 44/80  bone]
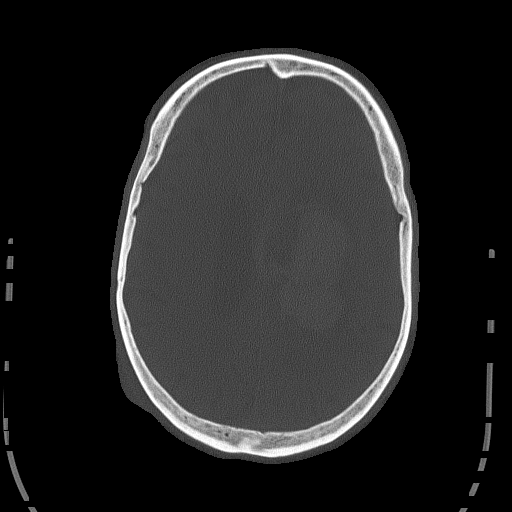
[im 52/80  brain]
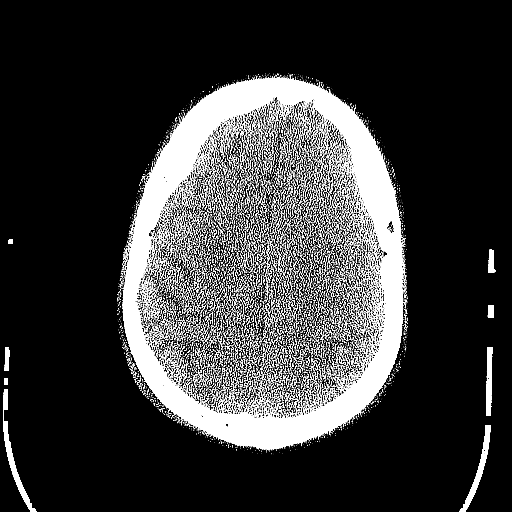
[im 56/80  brain]
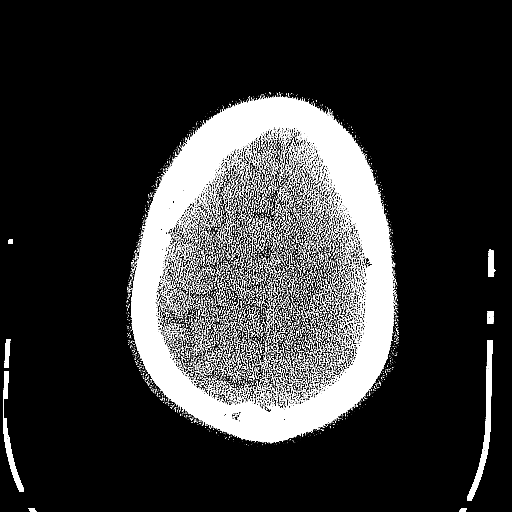
[im 60/80  brain]
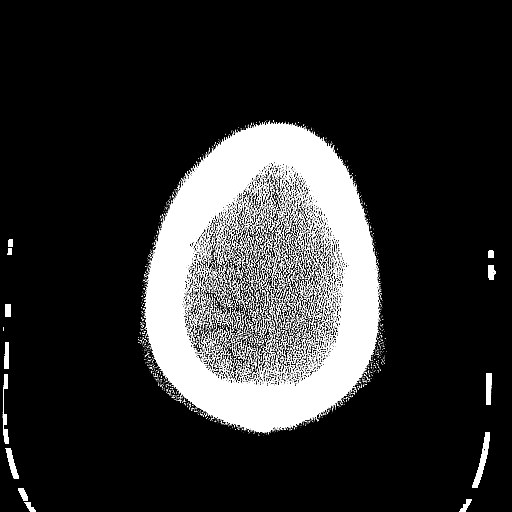
[im 64/80  brain]
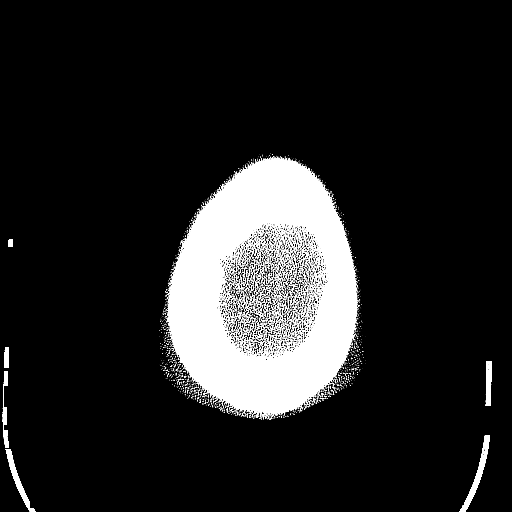
[im 64/80  bone]
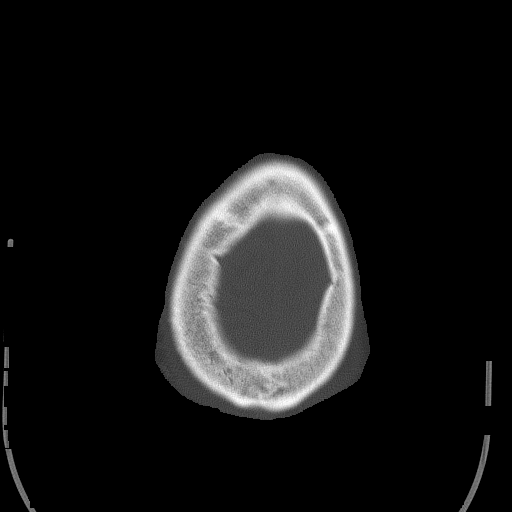
[im 72/80  brain]
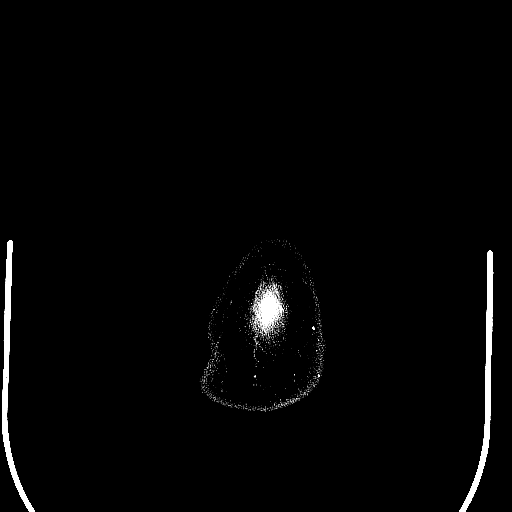
[im 76/80  brain]
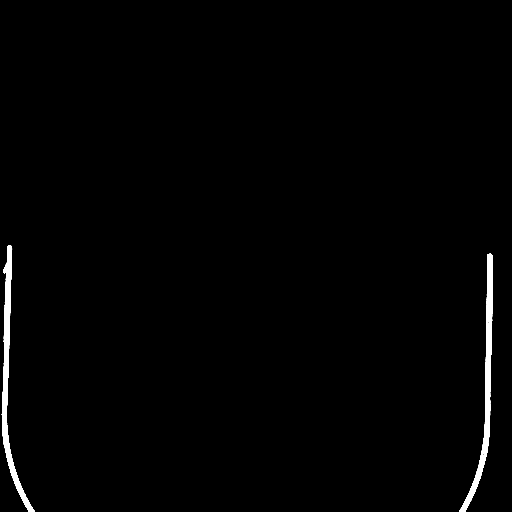

[15 of 30 positions shown; findings below may reference images not displayed]

FINDINGS: Mild intermittent motion artifact.

No acute osseous abnormality identified. Mild broad-based scalp
hematoma along the posterior vertex. Mastoids and paranasal sinuses
remain well pneumatized. Negative orbits soft tissues.

Large intra-axial hyperdense hemorrhage re - identified in the left
hemisphere encompassing 88 x 52 mm (AP by transverse) versus
previously 80 x 52 mm at a comparable level. Estimated volume of
intra-axial blood is 137 mL. Intraventricular extension with
moderate volume of lateral ventricle hemorrhage re- identified. Mass
effect with rightward midline shift of 14 mm appears stable.
Surrounding edema has not significantly changed.

No associated subdural hemorrhage identified. Trace amount of
subarachnoid hemorrhage in the right sylvian fissure. Basilar
cisterns remain patent. No ventriculomegaly. Stable gray-white
matter differentiation. No new cortically based infarct.
IMPRESSION: 1. Stable to slight enlargement of large intra-axial left hemisphere
hemorrhage since yesterday. Estimated intra-axial blood volume is
137 mL.
2. Stable intraventricular extension. No ventriculomegaly. Trace
subarachnoid hemorrhage in the right sylvian fissure.
3. Rightward midline shift is stable at 14 mm. Basilar cisterns
remain patent.
4. No new intracranial abnormality identified.

## 2016-07-12 IMAGING — CR DG CHEST 1V PORT
1 series · 1 of 1 positions shown · non-contrast
Comparison: 11/28/2014, 02/04/2013

CLINICAL DATA: 63-year-old male admitted with intracranial
hemorrhage

EXAM:
PORTABLE CHEST - 1 VIEW

[AP]
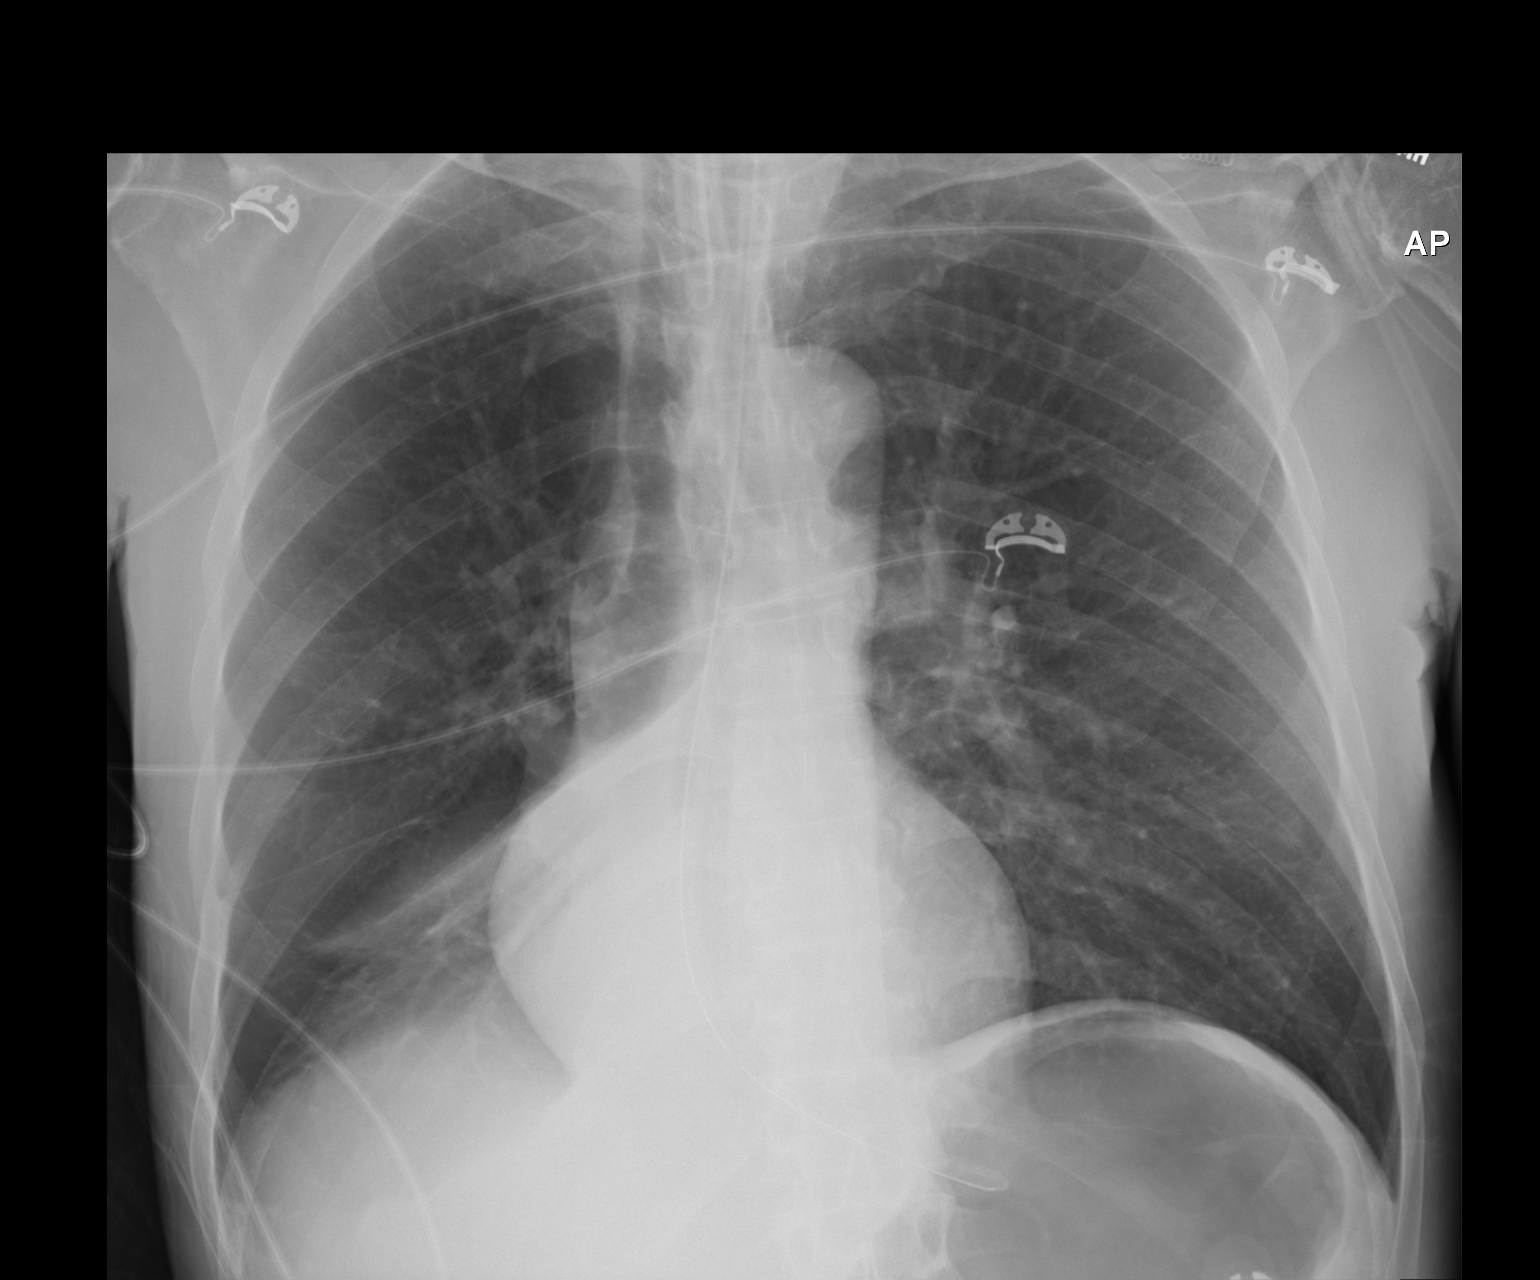

[1 of 1 positions shown; findings below may reference images not displayed]

FINDINGS: Right rotation.

Relatively unchanged endotracheal tube, terminating suitably above
the carina.

Interval placement of gastric tube. The tip terminates in the region
of the stomach gas, though the side port appears to terminate above
the GE junction.

Interval development of right basilar opacity, partially obscuring
the right hemidiaphragm in the right heart border.

Unchanged cardiomediastinal silhouette without evidence of central
vascular congestion.

No pneumothorax.  Left lung relatively well aerated.
IMPRESSION: Interval placement of gastric tube which terminates in the region of
the stomach gas. The side port appears to terminate above the GE
junction, and the tube may be advanced several cm for better
position.

Unchanged endotracheal tube.

Interval development of opacity at the right base, which may
represent atelectasis and/or consolidation/aspiration.

These results were called by telephone at the time of interpretation
on 11/30/2014 at [DATE] to the nurse caring for the patient,
Didiel, who verbally acknowledged these results.
